# Patient Record
Sex: Female | Born: 1990 | Race: White | Hispanic: No | Marital: Married | Smoking: Never smoker
Health system: Southern US, Community
[De-identification: ages and names within clinical notes are randomized; demographics above are authoritative.]

## PROBLEM LIST (undated history)

## (undated) DIAGNOSIS — R51 Headache: Secondary | ICD-10-CM

## (undated) DIAGNOSIS — J45909 Unspecified asthma, uncomplicated: Secondary | ICD-10-CM

## (undated) DIAGNOSIS — R519 Headache, unspecified: Secondary | ICD-10-CM

## (undated) DIAGNOSIS — N3010 Interstitial cystitis (chronic) without hematuria: Secondary | ICD-10-CM

## (undated) DIAGNOSIS — R102 Pelvic and perineal pain: Secondary | ICD-10-CM

## (undated) HISTORY — DX: Headache, unspecified: R51.9

## (undated) HISTORY — PX: WISDOM TOOTH EXTRACTION: SHX21

## (undated) HISTORY — DX: Headache: R51

## (undated) HISTORY — DX: Interstitial cystitis (chronic) without hematuria: N30.10

## (undated) HISTORY — PX: TONSILLECTOMY: SUR1361

---
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
## 2012-02-28 ENCOUNTER — Ambulatory Visit: Payer: Self-pay | Admitting: Urology

## 2012-02-28 HISTORY — PX: OTHER SURGICAL HISTORY: SHX169

## 2012-02-28 LAB — PREGNANCY, URINE: Pregnancy Test, Urine: NEGATIVE m[IU]/mL

## 2012-03-20 ENCOUNTER — Ambulatory Visit: Payer: Self-pay | Admitting: Urology

## 2013-01-08 ENCOUNTER — Ambulatory Visit: Payer: Self-pay | Admitting: Family Medicine

## 2013-01-08 LAB — URINALYSIS, COMPLETE
Glucose,UR: NEGATIVE mg/dL (ref 0–75)
Ketone: NEGATIVE
Ph: 6.5 (ref 4.5–8.0)

## 2013-01-10 LAB — URINE CULTURE

## 2013-01-11 LAB — BETA STREP CULTURE(ARMC)

## 2014-06-15 ENCOUNTER — Inpatient Hospital Stay (HOSPITAL_COMMUNITY)
Admission: AD | Admit: 2014-06-15 | Discharge: 2014-06-15 | Disposition: A | Discharge status: Home / Self Care | Payer: BC Managed Care – PPO | Source: Ambulatory Visit | Attending: Obstetrics and Gynecology | Admitting: Obstetrics and Gynecology

## 2014-06-15 ENCOUNTER — Encounter (HOSPITAL_COMMUNITY): Payer: Self-pay

## 2014-06-15 ENCOUNTER — Inpatient Hospital Stay (HOSPITAL_COMMUNITY): Payer: BC Managed Care – PPO

## 2014-06-15 DIAGNOSIS — R1031 Right lower quadrant pain: Secondary | ICD-10-CM | POA: Insufficient documentation

## 2014-06-15 DIAGNOSIS — R52 Pain, unspecified: Secondary | ICD-10-CM | POA: Insufficient documentation

## 2014-06-15 DIAGNOSIS — N949 Unspecified condition associated with female genital organs and menstrual cycle: Secondary | ICD-10-CM

## 2014-06-15 DIAGNOSIS — R102 Pelvic and perineal pain: Secondary | ICD-10-CM

## 2014-06-15 LAB — URINALYSIS, ROUTINE W REFLEX MICROSCOPIC
BILIRUBIN URINE: NEGATIVE
GLUCOSE, UA: NEGATIVE mg/dL
Hgb urine dipstick: NEGATIVE
KETONES UR: NEGATIVE mg/dL
Leukocytes, UA: NEGATIVE
Nitrite: NEGATIVE
PH: 8.5 — AB (ref 5.0–8.0)
Protein, ur: NEGATIVE mg/dL
Specific Gravity, Urine: 1.01 (ref 1.005–1.030)
Urobilinogen, UA: 0.2 mg/dL (ref 0.0–1.0)

## 2014-06-15 LAB — CBC
HCT: 40.7 % (ref 36.0–46.0)
Hemoglobin: 14.1 g/dL (ref 12.0–15.0)
MCH: 31.8 pg (ref 26.0–34.0)
MCHC: 34.6 g/dL (ref 30.0–36.0)
MCV: 91.9 fL (ref 78.0–100.0)
PLATELETS: 278 10*3/uL (ref 150–400)
RBC: 4.43 MIL/uL (ref 3.87–5.11)
RDW: 13.1 % (ref 11.5–15.5)
WBC: 8.9 10*3/uL (ref 4.0–10.5)

## 2014-06-15 LAB — WET PREP, GENITAL
TRICH WET PREP: NONE SEEN
Yeast Wet Prep HPF POC: NONE SEEN

## 2014-06-15 LAB — POCT PREGNANCY, URINE: Preg Test, Ur: NEGATIVE

## 2014-06-15 MED ORDER — KETOROLAC TROMETHAMINE 60 MG/2ML IM SOLN
60.0000 mg | INTRAMUSCULAR | Status: AC
  Administered 2014-06-15: 60 mg via INTRAMUSCULAR
  Filled 2014-06-15: qty 2

## 2014-06-15 MED ORDER — HYDROCODONE-ACETAMINOPHEN 5-325 MG PO TABS: 1.0000 | ORAL_TABLET | Freq: Four times a day (QID) | ORAL | Status: DC | PRN

## 2014-06-15 NOTE — Discharge Instructions (Signed)
Abdominal Pain, Women °Abdominal (stomach, pelvic, or belly) pain can be caused by many things. It is important to tell your doctor: °· The location of the pain. °· Does it come and go or is it present all the time? °· Are there things that start the pain (eating certain foods, exercise)? °· Are there other symptoms associated with the pain (fever, nausea, vomiting, diarrhea)? °All of this is helpful to know when trying to find the cause of the pain. °CAUSES  °· Stomach: virus or bacteria infection, or ulcer. °· Intestine: appendicitis (inflamed appendix), regional ileitis (Crohn's disease), ulcerative colitis (inflamed colon), irritable bowel syndrome, diverticulitis (inflamed diverticulum of the colon), or cancer of the stomach or intestine. °· Gallbladder disease or stones in the gallbladder. °· Kidney disease, kidney stones, or infection. °· Pancreas infection or cancer. °· Fibromyalgia (pain disorder). °· Diseases of the female organs: °¨ Uterus: fibroid (non-cancerous) tumors or infection. °¨ Fallopian tubes: infection or tubal pregnancy. °¨ Ovary: cysts or tumors. °¨ Pelvic adhesions (scar tissue). °¨ Endometriosis (uterus lining tissue growing in the pelvis and on the pelvic organs). °¨ Pelvic congestion syndrome (female organs filling up with blood just before the menstrual period). °¨ Pain with the menstrual period. °¨ Pain with ovulation (producing an egg). °¨ Pain with an IUD (intrauterine device, birth control) in the uterus. °¨ Cancer of the female organs. °· Functional pain (pain not caused by a disease, may improve without treatment). °· Psychological pain. °· Depression. °DIAGNOSIS  °Your doctor will decide the seriousness of your pain by doing an examination. °· Blood tests. °· X-rays. °· Ultrasound. °· CT scan (computed tomography, special type of X-ray). °· MRI (magnetic resonance imaging). °· Cultures, for infection. °· Barium enema (dye inserted in the large intestine, to better view it with  X-rays). °· Colonoscopy (looking in intestine with a lighted tube). °· Laparoscopy (minor surgery, looking in abdomen with a lighted tube). °· Major abdominal exploratory surgery (looking in abdomen with a large incision). °TREATMENT  °The treatment will depend on the cause of the pain.  °· Many cases can be observed and treated at home. °· Over-the-counter medicines recommended by your caregiver. °· Prescription medicine. °· Antibiotics, for infection. °· Birth control pills, for painful periods or for ovulation pain. °· Hormone treatment, for endometriosis. °· Nerve blocking injections. °· Physical therapy. °· Antidepressants. °· Counseling with a psychologist or psychiatrist. °· Minor or major surgery. °HOME CARE INSTRUCTIONS  °· Do not take laxatives, unless directed by your caregiver. °· Take over-the-counter pain medicine only if ordered by your caregiver. Do not take aspirin because it can cause an upset stomach or bleeding. °· Try a clear liquid diet (broth or water) as ordered by your caregiver. Slowly move to a bland diet, as tolerated, if the pain is related to the stomach or intestine. °· Have a thermometer and take your temperature several times a day, and record it. °· Bed rest and sleep, if it helps the pain. °· Avoid sexual intercourse, if it causes pain. °· Avoid stressful situations. °· Keep your follow-up appointments and tests, as your caregiver orders. °· If the pain does not go away with medicine or surgery, you may try: °¨ Acupuncture. °¨ Relaxation exercises (yoga, meditation). °¨ Group therapy. °¨ Counseling. °SEEK MEDICAL CARE IF:  °· You notice certain foods cause stomach pain. °· Your home care treatment is not helping your pain. °· You need stronger pain medicine. °· You want your IUD removed. °· You feel faint or   lightheaded. °· You develop nausea and vomiting. °· You develop a rash. °· You are having side effects or an allergy to your medicine. °SEEK IMMEDIATE MEDICAL CARE IF:  °· Your  pain does not go away or gets worse. °· You have a fever. °· Your pain is felt only in portions of the abdomen. The right side could possibly be appendicitis. The left lower portion of the abdomen could be colitis or diverticulitis. °· You are passing blood in your stools (bright red or black tarry stools, with or without vomiting). °· You have blood in your urine. °· You develop chills, with or without a fever. °· You pass out. °MAKE SURE YOU:  °· Understand these instructions. °· Will watch your condition. °· Will get help right away if you are not doing well or get worse. °Document Released: 09/04/2007 Document Revised: 03/24/2014 Document Reviewed: 09/24/2009 °[employer]® Patient Information ©2015 [employer]. This information is not intended to replace advice given to you by your health care provider. Make sure you discuss any questions you have with your health care provider. ° °

## 2014-06-15 NOTE — MAU Note (Signed)
Knife-like pain rt lower abd since this am. Nausea and vomited x one. LMP 06/01/2014

## 2014-06-15 NOTE — MAU Provider Note (Signed)
Chief Complaint: Abdominal Pain   First Provider Initiated Contact with Patient 06/15/14 1940     SUBJECTIVE HPI: Judith Chapman is a 23 y.o. G0 who presents to maternity admissions reporting RLQ constant pain all day today.  She had nausea earlier with vomiting x1.  She has hx of ruptured ovarian cysts and feels like this pain is similar.  Bowel and bladder habits have been normal.  She denies vaginal bleeding, vaginal itching/burning, urinary symptoms, h/a, dizziness, or fever/chills.     Past Medical History  Diagnosis Date  . Urinary tract infection    Past Surgical History  Procedure Laterality Date  . Cystostomy w/ bladder biopsy     History   Social History  . Marital Status: Single    Spouse Name: N/A    Number of Children: N/A  . Years of Education: N/A   Occupational History  . Not on file.   Social History Main Topics  . Smoking status: Never Smoker   . Smokeless tobacco: Not on file  . Alcohol Use: No  . Drug Use: No  . Sexual Activity: Not on file   Other Topics Concern  . Not on file   Social History Narrative  . No narrative on file   No current facility-administered medications on file prior to encounter.   No current outpatient prescriptions on file prior to encounter.   Allergies  Allergen Reactions  . Doxycycline Anaphylaxis  . Augmentin [Amoxicillin-Pot Clavulanate] Nausea And Vomiting  . Sulfur Rash    ROS: Pertinent items in HPI  OBJECTIVE Blood pressure 119/62, pulse 66, temperature 98.1 F (36.7 C), temperature source Oral, resp. rate 16, height 5' 5.25" (1.657 m), weight 63.504 kg (140 lb), last menstrual period 06/01/2014, SpO2 100.00%. GENERAL: Well-developed, well-nourished female in no acute distress.  HEENT: Normocephalic HEART: normal rate RESP: normal effort ABDOMEN: Soft, mild tenderness right lower abdomen, no rebound tenderness, no guarding EXTREMITIES: Nontender, no edema NEURO: Alert and oriented Pelvic exam: Cervix  pink, visually closed, without lesion, scant white creamy discharge, vaginal walls and external genitalia normal Bimanual exam: Cervix 0/long/high, firm, anterior, neg CMT, uterus nontender, nonenlarged, adnexa with mild tenderness on right, no enlargement or mass bilaterally  LAB RESULTS Results for orders placed during the hospital encounter of 06/15/14 (from the past 24 hour(s))  URINALYSIS, ROUTINE W REFLEX MICROSCOPIC     Status: Abnormal   Collection Time    06/15/14  7:10 PM      Result Value Ref Range   Color, Urine YELLOW  YELLOW   APPearance CLEAR  CLEAR   Specific Gravity, Urine 1.010  1.005 - 1.030   pH 8.5 (*) 5.0 - 8.0   Glucose, UA NEGATIVE  NEGATIVE mg/dL   Hgb urine dipstick NEGATIVE  NEGATIVE   Bilirubin Urine NEGATIVE  NEGATIVE   Ketones, ur NEGATIVE  NEGATIVE mg/dL   Protein, ur NEGATIVE  NEGATIVE mg/dL   Urobilinogen, UA 0.2  0.0 - 1.0 mg/dL   Nitrite NEGATIVE  NEGATIVE   Leukocytes, UA NEGATIVE  NEGATIVE  POCT PREGNANCY, URINE     Status: None   Collection Time    06/15/14  7:22 PM      Result Value Ref Range   Preg Test, Ur NEGATIVE  NEGATIVE  CBC     Status: None   Collection Time    06/15/14  8:10 PM      Result Value Ref Range   WBC 8.9  4.0 - 10.5 K/uL   RBC  4.43  3.87 - 5.11 MIL/uL   Hemoglobin 14.1  12.0 - 15.0 g/dL   HCT 27.040.7  35.036.0 - 09.346.0 %   MCV 91.9  78.0 - 100.0 fL   MCH 31.8  26.0 - 34.0 pg   MCHC 34.6  30.0 - 36.0 g/dL   RDW 81.813.1  29.911.5 - 37.115.5 %   Platelets 278  150 - 400 K/uL  WET PREP, GENITAL     Status: Abnormal   Collection Time    06/15/14  8:19 PM      Result Value Ref Range   Yeast Wet Prep HPF POC NONE SEEN  NONE SEEN   Trich, Wet Prep NONE SEEN  NONE SEEN   Clue Cells Wet Prep HPF POC FEW (*) NONE SEEN   WBC, Wet Prep HPF POC FEW (*) NONE SEEN    IMAGING [place] Transvaginal Non-ob  06/15/2014   CLINICAL DATA:  Right lower quadrant pain  EXAM: TRANSABDOMINAL AND TRANSVAGINAL ULTRASOUND OF PELVIS  TECHNIQUE: Both transabdominal  and transvaginal ultrasound examinations of the pelvis were performed. Transabdominal technique was performed for global imaging of the pelvis including uterus, ovaries, adnexal regions, and pelvic cul-de-sac. It was necessary to proceed with endovaginal exam following the transabdominal exam to visualize the ovaries.  COMPARISON:  None  FINDINGS: Uterus  Measurements: 6.7 x 2.9 x 3.3 cm. No fibroids or other mass visualized.  Endometrium  Thickness: 6 mm.  No focal abnormality visualized.  Right ovary  Measurements: 26 x 17 x 17 mm. Normal appearance/no adnexal mass.  Left ovary  Measurements: 28 x 17 x 15 mm. Normal appearance/no adnexal mass.  Other findings  No free fluid.  IMPRESSION: Normal pelvic ultrasound   Electronically Signed   By: Esperanza Heiraymond  Rubner M.D.   On: 06/15/2014 21:59   [place] Pelvis Complete  06/15/2014   CLINICAL DATA:  Right lower quadrant pain  EXAM: TRANSABDOMINAL AND TRANSVAGINAL ULTRASOUND OF PELVIS  TECHNIQUE: Both transabdominal and transvaginal ultrasound examinations of the pelvis were performed. Transabdominal technique was performed for global imaging of the pelvis including uterus, ovaries, adnexal regions, and pelvic cul-de-sac. It was necessary to proceed with endovaginal exam following the transabdominal exam to visualize the ovaries.  COMPARISON:  None  FINDINGS: Uterus  Measurements: 6.7 x 2.9 x 3.3 cm. No fibroids or other mass visualized.  Endometrium  Thickness: 6 mm.  No focal abnormality visualized.  Right ovary  Measurements: 26 x 17 x 17 mm. Normal appearance/no adnexal mass.  Left ovary  Measurements: 28 x 17 x 15 mm. Normal appearance/no adnexal mass.  Other findings  No free fluid.  IMPRESSION: Normal pelvic ultrasound   Electronically Signed   By: Esperanza Heiraymond  Rubner M.D.   On: 06/15/2014 21:59    ASSESSMENT 1. Acute pelvic pain, female     PLAN Consult Dr Rana SnareLowe Discharge home Discussed normal labs and ultrasound with pt and her mother who is present.  Likely not  a Gyn cause for pain.  Vicodin 5/500, take 1-2 tabs Q 6 hours PRN x 10 tabs F/U with GI if symptoms persist If sudden increase in pain or n/v, go to [facility] or [facility] Return to MAU as needed for Gyn emergencies    Medication List         B COMPLEX PO  Take 2 tablets by mouth daily.     DULoxetine 30 MG capsule  Commonly known as:  CYMBALTA  Take 60 mg by mouth daily.     HYDROcodone-acetaminophen 5-325 MG  per tablet  Commonly known as:  NORCO/VICODIN  Take 1-2 tablets by mouth every 6 (six) hours as needed for moderate pain.     ibuprofen 200 MG tablet  Commonly known as:  ADVIL,MOTRIN  Take 800 mg by mouth every 6 (six) hours as needed for moderate pain.     MINASTRIN 24 FE 1-20 MG-MCG(24) Chew  Generic drug:  Norethin Ace-Eth Estrad-FE  Chew 1 tablet by mouth daily.       Follow-up Information   Please follow up. (With your GI doctor if symptoms persist.)       Please follow up. [facility] or [facility] if onset of nausea with vomitting or severe abdominal pain.  Return to MAU as needed for Gyn emergencies.)       Sharen Counter Certified Nurse-Midwife 06/15/2014  10:22 PM

## 2014-06-16 LAB — GC/CHLAMYDIA PROBE AMP
CT Probe RNA: NEGATIVE
GC Probe RNA: NEGATIVE

## 2014-06-18 ENCOUNTER — Ambulatory Visit: Payer: Self-pay | Admitting: Family Medicine

## 2014-09-30 ENCOUNTER — Ambulatory Visit: Payer: Self-pay | Admitting: Otolaryngology

## 2015-03-15 NOTE — Op Note (Signed)
PATIENT NAME:  Judith Chapman, Judith Chapman MR#:  [id number] DATE OF BIRTH:  [birth year]  DATE OF PROCEDURE:  02/28/2012  PREOPERATIVE DIAGNOSIS: Bladder pain and pressure.   POSTOPERATIVE DIAGNOSIS: Bladder pain and pressure. Bladder neoplasm uncertain.   PROCEDURE: Cystoscopy, hydrodistention,  bladder biopsy.   SURGEON: Assunta GamblesBrian Phoenyx Paulsen, Chapman.D.   ANESTHESIA: Laryngeal mask airway anesthesia.   INDICATIONS: The patient is an 24 year old white female with a long history of bladder issues. She has had significant bladder pain, pressure, urinary frequency, urgency with symptoms suggestive of possible underlying interstitial cystitis. She presents for cystoscopy and hydrodistention.   DESCRIPTION OF PROCEDURE: After informed consent was obtained, the patient was taken to the operating room and placed in the dorsal lithotomy position under laryngeal mask airway anesthesia. The patient was then prepped and draped in the usual standard fashion. The 22 French rigid cystoscope was introduced into the urethra under direct vision with no urethral abnormalities noted. Upon entering the bladder, the mucosa was inspected in its entirety and there was an area approximately 8 to 10 mm of raised irregular, nonerythematous mucosa on the right posterior bladder wall. It was fairly well defined. Prominent hypervascularity was also noted throughout the entire bladder with no other gross abnormalities. Bilateral ureteral orifices were well visualized with no lesions noted. The bladder was then filled under gravity instillation with sterile water. The first fill was taken to 650 mL. The second fill was to 800 mL. A third fill was to 825 mL. A few areas of patchy glomerulations were noted on the bladder base and dome of the bladder. Minimal bleeding from these areas was noted. No excoriations were appreciated. After the third fill, cold cup biopsy forceps were then utilized to obtain biopsies of the abnormal area on the right posterior bladder  wall. The area was then cauterized utilizing a Bugbee electrode. The specimen was sent to pathology for further evaluation. The bladder was then drained. The cystoscope was removed. An 318 French red rubber catheter was inserted into the urinary bladder. 40 mL of 2% lidocaine was instilled into the urinary bladder. The catheter was then removed. The patient was returned to the supine position.   Also of note, on examination and at placement of the B and O suppository, the patient's rectum was noted to be in an open position. Minimal rectal tone was present. There was an approximate 1-cm opening of the rectum which is atypical with no paralysis. This may indicate a neurological source also for her symptoms. This may need further evaluation. She does have a long history also of bowel issues.   The patient was awakened from laryngeal mask airway anesthesia. She was taken to the recovery room in stable condition. There were no problems or complications. The patient tolerated the procedure well.     ____________________________ Madolyn FriezeBrian S. Achilles Dunkope, MD bsc:bjt D: 02/28/2012 10:11:47 ET T: 02/28/2012 10:33:57 ET JOB#: [id number]  cc: Madolyn FriezeBrian S. Achilles Dunkope, MD, <Dictator> Madolyn FriezeBRIAN S Lynnlee Revels MD ELECTRONICALLY SIGNED 02/29/2012 7:37

## 2015-03-16 LAB — SURGICAL PATHOLOGY

## 2015-08-04 ENCOUNTER — Other Ambulatory Visit: Payer: Self-pay | Admitting: Urology

## 2015-08-19 ENCOUNTER — Encounter (HOSPITAL_BASED_OUTPATIENT_CLINIC_OR_DEPARTMENT_OTHER): Payer: Self-pay | Admitting: *Deleted

## 2015-08-20 ENCOUNTER — Encounter (HOSPITAL_BASED_OUTPATIENT_CLINIC_OR_DEPARTMENT_OTHER): Payer: Self-pay | Admitting: *Deleted

## 2015-08-20 NOTE — Progress Notes (Signed)
To [facility] at 1030-Hg,urine pregnancy on arrival-instructed Npo after [place]-verbalized understands.

## 2015-08-26 NOTE — H&P (Signed)
History of Present Illness   I was consulted by Julio Sicks, NP regarding Judith Chapman's recurrent urinary tract infections with possible history of interstitial cystitis.   She thinks once or twice a month she will have an acute onset of dysuria and/or back pain and cramps that usually respond favorably to antibiotics. She had a more protracted episode not responding to ciprofloxacin recently. It had felt different and she felt sick to her stomach.    When I saw Judith Chapman in July 2016 she had vaginal dryness that was out of the ordinary for her age. She was on trimethoprim. She had been doing well but had gone to the beach and may have had bladder irritants. She was on Uribel and her culture was negative. She may need a hydrodistention.   Urinalysis: No bacteria.   Frequency: Stable.    Past Medical History Problems  1. History of Anxiety (F41.9) 2. History of asthma (Z87.09) 3. History of esophageal reflux (Z87.19) 4. History of urinary tract infection (Z87.440)  Surgical History Problems  1. History of Cystoscopy (Diagnostic) 2. History of Oral Surgery Tooth Extraction 3. History of Septoplasty 4. History of Tonsillectomy  Current Meds 1. Minastrin 24 Fe CHEW;  Therapy: (Recorded:13May2016) to Recorded 2. Trimethoprim 100 MG Oral Tablet; 100 mg daily;  Therapy: 02Jun2016 to (Last Rx:02Jun2016)  Requested for: 02Jun2016 Ordered 3. Uribel 118 MG Oral Capsule; TAKE 1 CAPSULE Twice daily PRN;  Therapy: 25Jul2016 to (Last Rx:25Jul2016) Ordered  Allergies Medication  1. doxycycline 2. Augmentin 3. Sulfa Drugs  Family History Problems  1. Family history of hypertension (Z82.49) : Mother 2. Family history of kidney stones (Z84.1) : Mother, Grandfather  Social History Problems  1. Alcohol use (Z78.9)   social drinking only 2. Employed   photographer 3. Never a smoker 4. Occasional caffeine consumption  Results/Data  Urine [Data Includes: Last 1 Day]   12Sep2016   COLOR STRAW   APPEARANCE CLEAR   SPECIFIC GRAVITY <1.005   pH 6.5   GLUCOSE NEGATIVE   BILIRUBIN NEGATIVE   KETONE NEGATIVE   BLOOD TRACE   PROTEIN NEGATIVE   NITRITE NEGATIVE   LEUKOCYTE ESTERASE NEGATIVE   SQUAMOUS EPITHELIAL/HPF NONE SEEN HPF  WBC NONE SEEN WBC/HPF  RBC 0-2 RBC/HPF  BACTERIA NONE SEEN HPF  CRYSTALS See Below HPF  CASTS NONE SEEN LPF  Yeast NONE SEEN HPF   Assessment Assessed  1. Dyspareunia, female (N94.1) 2. Chronic cystitis (N30.20)  Plan Dyspareunia, female  1. Start: Meloxicam 15 MG Oral Tablet (Mobic); TAKE 1 TABLET DAILY  Discussion/Summary   Judith Chapman is still having a lot of bladder irritation and frequency. I am going to send her urine again for culture. She is on daily trimethoprim. She is having good days and bad days on and off Uribel.   I went over hydrodistention.  We talked about cystoscopy/hydrodistention and instillation in detail. Pros, cons, general surgical and anesthetic risks, and other options including watchful waiting were discussed. Risks were described but not limited to pain, infection, and bleeding. The risk of bladder perforation and management were discussed. The patient understands that it is primarily a diagnostic procedure.   She may have had this done by Dr Achilles Dunk in [place].   Urine was sent for culture. I gave her a Mobic prescription 15 mg #30 tablets and 2 refills and reviewed the side effects. She will stop her trimethoprim. She will take Uribel p.r.n. Proceed with hydrodistention.   After a thorough review of the management  options for the patient's condition the patient  elected to proceed with surgical therapy as noted above. We have discussed the potential benefits and risks of the procedure, side effects of the proposed treatment, the likelihood of the patient achieving the goals of the procedure, and any potential problems that might occur during the procedure or recuperation. Informed consent has been  obtained.

## 2015-08-27 ENCOUNTER — Encounter (HOSPITAL_BASED_OUTPATIENT_CLINIC_OR_DEPARTMENT_OTHER): Payer: Self-pay | Admitting: Anesthesiology

## 2015-08-27 ENCOUNTER — Ambulatory Visit (HOSPITAL_BASED_OUTPATIENT_CLINIC_OR_DEPARTMENT_OTHER): Payer: 59 | Admitting: Anesthesiology

## 2015-08-27 ENCOUNTER — Ambulatory Visit (HOSPITAL_BASED_OUTPATIENT_CLINIC_OR_DEPARTMENT_OTHER)
Admission: RE | Admit: 2015-08-27 | Discharge: 2015-08-27 | Disposition: A | Discharge status: Home / Self Care | Payer: 59 | Source: Ambulatory Visit | Attending: Urology | Admitting: Urology

## 2015-08-27 ENCOUNTER — Encounter (HOSPITAL_BASED_OUTPATIENT_CLINIC_OR_DEPARTMENT_OTHER)
Admission: RE | Disposition: A | Discharge status: Home / Self Care | Payer: Self-pay | Source: Ambulatory Visit | Attending: Urology

## 2015-08-27 DIAGNOSIS — Z882 Allergy status to sulfonamides status: Secondary | ICD-10-CM | POA: Diagnosis not present

## 2015-08-27 DIAGNOSIS — K219 Gastro-esophageal reflux disease without esophagitis: Secondary | ICD-10-CM | POA: Insufficient documentation

## 2015-08-27 DIAGNOSIS — F419 Anxiety disorder, unspecified: Secondary | ICD-10-CM | POA: Diagnosis not present

## 2015-08-27 DIAGNOSIS — J45909 Unspecified asthma, uncomplicated: Secondary | ICD-10-CM | POA: Insufficient documentation

## 2015-08-27 DIAGNOSIS — Z881 Allergy status to other antibiotic agents status: Secondary | ICD-10-CM | POA: Insufficient documentation

## 2015-08-27 DIAGNOSIS — N941 Unspecified dyspareunia: Secondary | ICD-10-CM | POA: Insufficient documentation

## 2015-08-27 DIAGNOSIS — N3010 Interstitial cystitis (chronic) without hematuria: Secondary | ICD-10-CM | POA: Diagnosis not present

## 2015-08-27 DIAGNOSIS — R102 Pelvic and perineal pain: Secondary | ICD-10-CM | POA: Diagnosis present

## 2015-08-27 HISTORY — DX: Pelvic and perineal pain: R10.2

## 2015-08-27 HISTORY — PX: CYSTO WITH HYDRODISTENSION: SHX5453

## 2015-08-27 LAB — POCT PREGNANCY, URINE: PREG TEST UR: NEGATIVE

## 2015-08-27 LAB — POCT HEMOGLOBIN-HEMACUE: HEMOGLOBIN: 14.6 g/dL (ref 12.0–15.0)

## 2015-08-27 SURGERY — CYSTOSCOPY, WITH BLADDER HYDRODISTENSION
Anesthesia: General | Site: Bladder

## 2015-08-27 MED ORDER — FENTANYL CITRATE (PF) 100 MCG/2ML IJ SOLN
INTRAMUSCULAR | Status: AC
  Filled 2015-08-27: qty 2

## 2015-08-27 MED ORDER — HYDROCODONE-ACETAMINOPHEN 5-325 MG PO TABS
ORAL_TABLET | ORAL | Status: AC
  Filled 2015-08-27: qty 1

## 2015-08-27 MED ORDER — FAMOTIDINE IN NACL 20-0.9 MG/50ML-% IV SOLN
INTRAVENOUS | Status: AC
  Filled 2015-08-27: qty 50

## 2015-08-27 MED ORDER — HYDROCODONE-ACETAMINOPHEN 5-325 MG PO TABS: 1.0000 | ORAL_TABLET | Freq: Four times a day (QID) | ORAL | Status: DC | PRN

## 2015-08-27 MED ORDER — LACTATED RINGERS IV SOLN
INTRAVENOUS | Status: DC
Start: 2015-08-27 — End: 2015-08-27
  Filled 2015-08-27: qty 1000

## 2015-08-27 MED ORDER — STERILE WATER FOR IRRIGATION IR SOLN
Status: DC | PRN
  Administered 2015-08-27: 1000 mL

## 2015-08-27 MED ORDER — FENTANYL CITRATE (PF) 100 MCG/2ML IJ SOLN
25.0000 ug | INTRAMUSCULAR | Status: DC | PRN
  Administered 2015-08-27 (×2): 25 ug via INTRAVENOUS
  Filled 2015-08-27: qty 1

## 2015-08-27 MED ORDER — MIDAZOLAM HCL 2 MG/2ML IJ SOLN
INTRAMUSCULAR | Status: AC
  Filled 2015-08-27: qty 2

## 2015-08-27 MED ORDER — PROPOFOL 10 MG/ML IV BOLUS
INTRAVENOUS | Status: DC | PRN
  Administered 2015-08-27: 170 mg via INTRAVENOUS

## 2015-08-27 MED ORDER — BUPIVACAINE HCL 0.5 % IJ SOLN
INTRAMUSCULAR | Status: DC | PRN
  Administered 2015-08-27: 15 mL

## 2015-08-27 MED ORDER — CIPROFLOXACIN IN D5W 400 MG/200ML IV SOLN
400.0000 mg | INTRAVENOUS | Status: AC
  Administered 2015-08-27: 400 mg via INTRAVENOUS
  Filled 2015-08-27: qty 200

## 2015-08-27 MED ORDER — LIDOCAINE HCL (CARDIAC) 20 MG/ML IV SOLN
INTRAVENOUS | Status: DC | PRN
  Administered 2015-08-27: 80 mg via INTRAVENOUS

## 2015-08-27 MED ORDER — LACTATED RINGERS IV SOLN
INTRAVENOUS | Status: DC
  Administered 2015-08-27: 11:00:00 via INTRAVENOUS
  Filled 2015-08-27: qty 1000

## 2015-08-27 MED ORDER — CITRIC ACID-SODIUM CITRATE 334-500 MG/5ML PO SOLN
30.0000 mL | Freq: Once | ORAL | Status: AC
  Administered 2015-08-27: 30 mL via ORAL
  Filled 2015-08-27: qty 30

## 2015-08-27 MED ORDER — HYDROCODONE-ACETAMINOPHEN 5-325 MG PO TABS
1.0000 | ORAL_TABLET | Freq: Once | ORAL | Status: AC
  Administered 2015-08-27: 1 via ORAL
  Filled 2015-08-27: qty 1

## 2015-08-27 MED ORDER — KETOROLAC TROMETHAMINE 30 MG/ML IJ SOLN
INTRAMUSCULAR | Status: DC | PRN
  Administered 2015-08-27: 30 mg via INTRAVENOUS

## 2015-08-27 MED ORDER — CITRIC ACID-SODIUM CITRATE 334-500 MG/5ML PO SOLN
ORAL | Status: AC
  Filled 2015-08-27: qty 30

## 2015-08-27 MED ORDER — MIDAZOLAM HCL 5 MG/5ML IJ SOLN
INTRAMUSCULAR | Status: DC | PRN
  Administered 2015-08-27: 2 mg via INTRAVENOUS

## 2015-08-27 MED ORDER — PHENAZOPYRIDINE HCL 200 MG PO TABS
ORAL_TABLET | ORAL | Status: DC | PRN
  Administered 2015-08-27: 12:00:00 via INTRAVESICAL

## 2015-08-27 MED ORDER — CIPROFLOXACIN IN D5W 400 MG/200ML IV SOLN
INTRAVENOUS | Status: AC
  Filled 2015-08-27: qty 200

## 2015-08-27 MED ORDER — ONDANSETRON HCL 4 MG/2ML IJ SOLN
INTRAMUSCULAR | Status: DC | PRN
  Administered 2015-08-27: 4 mg via INTRAVENOUS

## 2015-08-27 MED ORDER — DEXAMETHASONE SODIUM PHOSPHATE 4 MG/ML IJ SOLN
INTRAMUSCULAR | Status: DC | PRN
  Administered 2015-08-27: 10 mg via INTRAVENOUS

## 2015-08-27 MED ORDER — FAMOTIDINE IN NACL 20-0.9 MG/50ML-% IV SOLN
20.0000 mg | Freq: Once | INTRAVENOUS | Status: AC
  Administered 2015-08-27: 20 mg via INTRAVENOUS
  Filled 2015-08-27: qty 50

## 2015-08-27 MED ORDER — FENTANYL CITRATE (PF) 100 MCG/2ML IJ SOLN
INTRAMUSCULAR | Status: DC | PRN
  Administered 2015-08-27: 100 ug via INTRAVENOUS

## 2015-08-27 SURGICAL SUPPLY — 21 items
BAG DRAIN URO-CYSTO SKYTR STRL (DRAIN) ×3 IMPLANT
BAG DRN UROCATH (DRAIN) ×1
CANISTER SUCT LVC 12 LTR MEDI- (MISCELLANEOUS) IMPLANT
CATH FOLEY 2WAY SLVR  5CC 18FR (CATHETERS)
CATH FOLEY 2WAY SLVR 5CC 18FR (CATHETERS) IMPLANT
CATH ROBINSON RED A/P 12FR (CATHETERS) IMPLANT
CATH ROBINSON RED A/P 14FR (CATHETERS) IMPLANT
CLOTH BEACON ORANGE TIMEOUT ST (SAFETY) ×3 IMPLANT
DRSG PAD ABDOMINAL 8X10 ST (GAUZE/BANDAGES/DRESSINGS) ×2 IMPLANT
ELECT REM PT RETURN 9FT ADLT (ELECTROSURGICAL)
ELECTRODE REM PT RTRN 9FT ADLT (ELECTROSURGICAL) IMPLANT
GLOVE BIO SURGEON STRL SZ7.5 (GLOVE) ×3 IMPLANT
GOWN STRL REUS W/ TWL XL LVL3 (GOWN DISPOSABLE) ×1 IMPLANT
GOWN STRL REUS W/TWL XL LVL3 (GOWN DISPOSABLE) ×3
MANIFOLD NEPTUNE II (INSTRUMENTS) ×2 IMPLANT
NDL SAFETY ECLIPSE 18X1.5 (NEEDLE) ×1 IMPLANT
NEEDLE HYPO 18GX1.5 SHARP (NEEDLE) ×3
PACK CYSTO (CUSTOM PROCEDURE TRAY) ×3 IMPLANT
SUT SILK 0 TIES 10X30 (SUTURE) IMPLANT
SYR 20CC LL (SYRINGE) ×3 IMPLANT
WATER STERILE IRR 3000ML UROMA (IV SOLUTION) ×3 IMPLANT

## 2015-08-27 NOTE — Anesthesia Postprocedure Evaluation (Signed)
  Anesthesia Post-op Note  Patient: Judith Chapman  Procedure(s) Performed: Procedure(s) (LRB): CYSTOSCOPY HYDRODISTENSION AND INSTTILLATION OF MARCAINE AND PRYDIUM  (N/A)  Patient Location: PACU  Anesthesia Type: General  Level of Consciousness: awake and alert   Airway and Oxygen Therapy: Patient Spontanous Breathing  Post-op Pain: mild  Post-op Assessment: Post-op Vital signs reviewed, Patient's Cardiovascular Status Stable, Respiratory Function Stable, Patent Airway and No signs of Nausea or vomiting  Last Vitals:  Filed Vitals:   08/27/15 1245  BP: 98/71  Pulse: 79  Temp:   Resp: 16    Post-op Vital Signs: stable   Complications: No apparent anesthesia complications

## 2015-08-27 NOTE — Transfer of Care (Signed)
Immediate Anesthesia Transfer of Care Note  Patient: Judith Chapman  Procedure(s) Performed: Procedure(s): CYSTOSCOPY HYDRODISTENSION AND INSTTILLATION OF MARCAINE AND PRYDIUM  (N/A)  Patient Location: PACU  Anesthesia Type:General  Level of Consciousness: awake and oriented  Airway & Oxygen Therapy: Patient Spontanous Breathing  Post-op Assessment: Report given to RN  Post vital signs: Reviewed and stable  Last Vitals:  Filed Vitals:   08/27/15 1053  BP: 112/70  Pulse: 86  Temp: 36.7 C  Resp: 16    Complications: No apparent anesthesia complications

## 2015-08-27 NOTE — Discharge Instructions (Signed)
I have reviewed discharge instructions in detail with the patient. They will follow-up with me or their physician as scheduled. My nurse will also be calling the patients as per protocol.  ° ° ° °CYSTOSCOPY HOME CARE INSTRUCTIONS ° °Activity: °Rest for the remainder of the day.  Do not drive or operate equipment today.  You may resume normal activities in one to two days as instructed by your physician.  ° °Meals: °Drink plenty of liquids and eat light foods such as gelatin or soup this evening.  You may return to a normal meal plan tomorrow. ° °Return to Work: °You may return to work in one to two days or as instructed by your physician. ° °Special Instructions / Symptoms: °Call your physician if any of these symptoms occur: ° ° -persistent or heavy bleeding ° -bleeding which continues after first few urination ° -large blood clots that are difficult to pass ° -urine stream diminishes or stops completely ° -fever equal to or higher than 101 degrees Farenheit. ° -cloudy urine with a strong, foul odor ° -severe pain ° °Females should always wipe from front to back after elimination.  You may feel some burning pain when you urinate.  This should disappear with time.  Applying moist heat to the lower abdomen or a hot tub bath may help relieve the pain. \ ° °Follow-Up / Date of Return Visit to Your Physician:  °Call for an appointment to arrange follow-up. ° ° ° ° °Post Anesthesia Home Care Instructions ° °Activity: °Get plenty of rest for the remainder of the day. A responsible adult should stay with you for 24 hours following the procedure.  °For the next 24 hours, DO NOT: °-Drive a car °-Operate machinery °-Drink alcoholic beverages °-Take any medication unless instructed by your physician °-Make any legal decisions or sign important papers. ° °Meals: °Start with liquid foods such as gelatin or soup. Progress to regular foods as tolerated. Avoid greasy, spicy, heavy foods. If nausea and/or vomiting occur, drink only  clear liquids until the nausea and/or vomiting subsides. Call your physician if vomiting continues. ° °Special Instructions/Symptoms: °Your throat may feel dry or sore from the anesthesia or the breathing tube placed in your throat during surgery. If this causes discomfort, gargle with warm salt water. The discomfort should disappear within 24 hours. ° °If you had a scopolamine patch placed behind your ear for the management of post- operative nausea and/or vomiting: ° °1. The medication in the patch is effective for 72 hours, after which it should be removed.  Wrap patch in a tissue and discard in the trash. Wash hands thoroughly with soap and water. °2. You may remove the patch earlier than 72 hours if you experience unpleasant side effects which may include dry mouth, dizziness or visual disturbances. °3. Avoid touching the patch. Wash your hands with soap and water after contact with the patch. °  ° °

## 2015-08-27 NOTE — Anesthesia Preprocedure Evaluation (Addendum)
Anesthesia Evaluation  Patient identified by MRN, date of birth, ID band Patient awake    Reviewed: Allergy & Precautions, H&P , NPO status , Patient's Chart, lab work & pertinent test results  Airway Mallampati: II  TM Distance: >3 FB Neck ROM: full    Dental no notable dental hx. (+) Dental Advisory Given, Teeth Intact   Pulmonary neg pulmonary ROS,    Pulmonary exam normal breath sounds clear to auscultation       Cardiovascular Exercise Tolerance: Good negative cardio ROS Normal cardiovascular exam Rhythm:regular Rate:Normal     Neuro/Psych negative neurological ROS  negative psych ROS   GI/Hepatic negative GI ROS, Neg liver ROS, GERD  Medicated and Controlled,  Endo/Other  negative endocrine ROS  Renal/GU negative Renal ROS  negative genitourinary   Musculoskeletal   Abdominal   Peds  Hematology negative hematology ROS (+)   Anesthesia Other Findings   Reproductive/Obstetrics negative OB ROS                            Anesthesia Physical Anesthesia Plan  ASA: I  Anesthesia Plan: General   Post-op Pain Management:    Induction: Intravenous  Airway Management Planned: LMA  Additional Equipment:   Intra-op Plan:   Post-operative Plan:   Informed Consent: I have reviewed the patients History and Physical, chart, labs and discussed the procedure including the risks, benefits and alternatives for the proposed anesthesia with the patient or authorized representative who has indicated his/her understanding and acceptance.   Dental Advisory Given  Plan Discussed with: CRNA and Surgeon  Anesthesia Plan Comments:         Anesthesia Quick Evaluation

## 2015-08-27 NOTE — Anesthesia Procedure Notes (Signed)
Procedure Name: LMA Insertion Date/Time: 08/27/2015 12:18 PM Performed by: Maris Berger T Pre-anesthesia Checklist: Patient identified, Emergency Drugs available, Suction available and Patient being monitored Patient Re-evaluated:Patient Re-evaluated prior to inductionOxygen Delivery Method: Circle System Utilized Preoxygenation: Pre-oxygenation with 100% oxygen Intubation Type: IV induction Ventilation: Mask ventilation without difficulty LMA: LMA inserted LMA Size: 4.0 Number of attempts: 1 Airway Equipment and Method: Bite block Placement Confirmation: positive ETCO2 Tube secured with: Tape Dental Injury: Teeth and Oropharynx as per pre-operative assessment

## 2015-08-27 NOTE — Interval H&P Note (Signed)
History and Physical Interval Note:  08/27/2015 10:49 AM  Judith Chapman  has presented today for surgery, with the diagnosis of PELVIC PAIN   The various methods of treatment have been discussed with the patient and family. After consideration of risks, benefits and other options for treatment, the patient has consented to  Procedure(s): CYSTOSCOPY HYDRODISTENSION AND INSTTILLATION OF MARCAINE AND PRYDIUM  (N/A) as a surgical intervention .  The patient's history has been reviewed, patient examined, no change in status, stable for surgery.  I have reviewed the patient's chart and labs.  Questions were answered to the patient's satisfaction.     Mamie Hundertmark A

## 2015-08-27 NOTE — Op Note (Signed)
Preoperative diagnosis: Pelvic pain Postoperative diagnosis: Interstitial cystitis Surgery: Cystoscopy and hydrodistention and bladder installation therapy Surgeon: Dr. Lorin Picket Majestic Brister  The patient is above diagnoses and consented above procedure. Extra care was taken with leg positioning. Preoperative antibodies were given  21 [place] scope was utilized. Bladder mucosa and trigone were normal. She was hydrodistended to 600 mL. Bladder was emptied. On reinspection she had mild glomerulations throughout her bladder.  As a separate procedure after inking the bladder with a red rubber catheter I instilled 15 mL of 0.5% Marcaine +400 mg of peridium  The patient will be treated for interstitial cystitis

## 2015-08-28 ENCOUNTER — Encounter (HOSPITAL_BASED_OUTPATIENT_CLINIC_OR_DEPARTMENT_OTHER): Payer: Self-pay | Admitting: Urology

## 2017-06-01 ENCOUNTER — Emergency Department
Admission: EM | Admit: 2017-06-01 | Discharge: 2017-06-01 | Disposition: A | Discharge status: Home / Self Care | Payer: 59 | Attending: Student in an Organized Health Care Education/Training Program | Admitting: Student in an Organized Health Care Education/Training Program

## 2017-06-01 ENCOUNTER — Encounter: Payer: Self-pay | Admitting: Emergency Medicine

## 2017-06-01 DIAGNOSIS — R51 Headache: Secondary | ICD-10-CM | POA: Diagnosis not present

## 2017-06-01 DIAGNOSIS — Z79899 Other long term (current) drug therapy: Secondary | ICD-10-CM | POA: Diagnosis not present

## 2017-06-01 DIAGNOSIS — R519 Headache, unspecified: Secondary | ICD-10-CM

## 2017-06-01 LAB — URINALYSIS, COMPLETE (UACMP) WITH MICROSCOPIC
BILIRUBIN URINE: NEGATIVE
Bacteria, UA: NONE SEEN
GLUCOSE, UA: NEGATIVE mg/dL
KETONES UR: NEGATIVE mg/dL
LEUKOCYTES UA: NEGATIVE
NITRITE: NEGATIVE
Protein, ur: NEGATIVE mg/dL
Specific Gravity, Urine: 1.024 (ref 1.005–1.030)
pH: 6 (ref 5.0–8.0)

## 2017-06-01 LAB — BASIC METABOLIC PANEL
ANION GAP: 9 (ref 5–15)
BUN: 16 mg/dL (ref 6–20)
CHLORIDE: 104 mmol/L (ref 101–111)
CO2: 26 mmol/L (ref 22–32)
Calcium: 9.2 mg/dL (ref 8.9–10.3)
Creatinine, Ser: 0.82 mg/dL (ref 0.44–1.00)
GFR calc Af Amer: >60 mL/min (ref >60)
GFR calc non Af Amer: >60 mL/min (ref >60)
Glucose, Bld: 103 mg/dL — ABNORMAL HIGH (ref 65–99)
POTASSIUM: 4.3 mmol/L (ref 3.5–5.1)
SODIUM: 139 mmol/L (ref 135–145)

## 2017-06-01 LAB — CBC WITH DIFFERENTIAL/PLATELET
BASOS ABS: 0 10*3/uL (ref 0–0.1)
Basophils Relative: 1 %
EOS ABS: 0.2 10*3/uL (ref 0–0.7)
EOS PCT: 2 %
HCT: 44.4 % (ref 35.0–47.0)
HEMOGLOBIN: 15.2 g/dL (ref 12.0–16.0)
LYMPHS ABS: 2.2 10*3/uL (ref 1.0–3.6)
LYMPHS PCT: 29 %
MCH: 31.3 pg (ref 26.0–34.0)
MCHC: 34.3 g/dL (ref 32.0–36.0)
MCV: 91.1 fL (ref 80.0–100.0)
Monocytes Absolute: 0.5 10*3/uL (ref 0.2–0.9)
Monocytes Relative: 7 %
NEUTROS PCT: 61 %
Neutro Abs: 4.7 10*3/uL (ref 1.4–6.5)
PLATELETS: 265 10*3/uL (ref 150–440)
RBC: 4.87 MIL/uL (ref 3.80–5.20)
RDW: 13.1 % (ref 11.5–14.5)
WBC: 7.7 10*3/uL (ref 3.6–11.0)

## 2017-06-01 LAB — POCT PREGNANCY, URINE: PREG TEST UR: NEGATIVE

## 2017-06-01 LAB — TSH: TSH: 1.128 u[IU]/mL (ref 0.350–4.500)

## 2017-06-01 MED ORDER — SODIUM CHLORIDE 0.9 % IV BOLUS (SEPSIS): 1000.0000 mL | Freq: Once | INTRAVENOUS | Status: DC

## 2017-06-01 MED ORDER — KETOROLAC TROMETHAMINE 30 MG/ML IJ SOLN
15.0000 mg | Freq: Once | INTRAMUSCULAR | Status: AC
  Administered 2017-06-01: 15 mg via INTRAVENOUS
  Filled 2017-06-01: qty 1

## 2017-06-01 MED ORDER — DIPHENHYDRAMINE HCL 50 MG/ML IJ SOLN
12.5000 mg | Freq: Once | INTRAMUSCULAR | Status: AC
  Administered 2017-06-01: 12.5 mg via INTRAVENOUS
  Filled 2017-06-01: qty 1

## 2017-06-01 MED ORDER — SODIUM CHLORIDE 0.9 % IV BOLUS (SEPSIS)
1000.0000 mL | Freq: Once | INTRAVENOUS | Status: AC
  Administered 2017-06-01: 1000 mL via INTRAVENOUS

## 2017-06-01 MED ORDER — PROCHLORPERAZINE EDISYLATE 5 MG/ML IJ SOLN
10.0000 mg | Freq: Once | INTRAMUSCULAR | Status: AC
  Administered 2017-06-01: 10 mg via INTRAVENOUS
  Filled 2017-06-01: qty 2

## 2017-06-01 MED ORDER — DEXAMETHASONE SODIUM PHOSPHATE 10 MG/ML IJ SOLN
10.0000 mg | Freq: Once | INTRAMUSCULAR | Status: AC
  Administered 2017-06-01: 10 mg via INTRAVENOUS
  Filled 2017-06-01 (×2): qty 1

## 2017-06-01 NOTE — Discharge Instructions (Signed)

## 2017-06-01 NOTE — ED Triage Notes (Addendum)
Headache off and on x2 weeks, frontal/sinus and posterior /neck stiffness.  [place],  Vision fine , but with episodes on blurry/watery vision peripheral.  No fever noted. But feeling fatigue

## 2017-06-01 NOTE — ED Notes (Signed)
Pt alert and oriented X4, active, cooperative, pt in NAD. RR even and unlabored, color WNL.  Pt informed to return if any life threatening symptoms occur.   

## 2017-06-01 NOTE — ED Provider Notes (Signed)
[facility] Emergency Department Provider Note    First MD Initiated Contact with Patient 06/01/17 1526     (approximate)  I have reviewed the triage vital signs and the nursing notes.   HISTORY  Chief Complaint Headache    HPI Judith Chapman is a 26 y.o. female presents with chief complaint of 2 weeks of headache associated some neck stiffness and generalized fatigue. No fevers. No numbness or tingling. Denies any trauma. States that 2 weeks ago and felt very consistent to be a migraine for 2 days. The headache subsided but then came back in her symptoms are more related to frontal pressure and heaviness. Has not been having any cough or nasal congestion. No rashes. Did notice a tick on her week ago but it had not bitten her and her headache started before the tick. Did recently travel to [place] on a cruise and was him down for less than a day today. No associated nausea vomiting or chest pain.   Past Medical History:  Diagnosis Date  . Pelvic pain in female    No family history on file. Past Surgical History:  Procedure Laterality Date  . CYSTO WITH HYDRODISTENSION N/A 08/27/2015   Procedure: CYSTOSCOPY HYDRODISTENSION AND INSTTILLATION OF MARCAINE AND PRYDIUM ;  Surgeon: Alfredo Martinez, MD;  Location: [facility];  Service: Urology;  Laterality: N/A;  . CYSTO/  HYDRODISTENTION/  BLADDER BX  02-28-2012   There are no active problems to display for this patient.     Prior to Admission medications   Medication Sig Start Date End Date Taking? Authorizing Provider  B Complex Vitamins (B COMPLEX PO) Take 2 tablets by mouth as needed.     [provider]  DULoxetine (CYMBALTA) 30 MG capsule Take 60 mg by mouth daily.    [provider]  HYDROcodone-acetaminophen (NORCO) 5-325 MG tablet Take 1 tablet by mouth every 6 (six) hours as needed for moderate pain. 08/27/15   Alfredo Martinez, MD  ibuprofen (ADVIL,MOTRIN) 200  MG tablet Take 800 mg by mouth every 6 (six) hours as needed for moderate pain.    [provider]  meloxicam (MOBIC) 7.5 MG tablet Take 7.5 mg by mouth daily.    [provider]  Norethin Ace-Eth Estrad-FE (MINASTRIN 24 FE) 1-20 MG-MCG(24) CHEW Chew 1 tablet by mouth daily.    [provider]  trimethoprim (TRIMPEX) 100 MG tablet Take 100 mg by mouth daily.    [provider]    Allergies Doxycycline; Morphine and related; Augmentin [amoxicillin-pot clavulanate]; and Sulfa antibiotics    Social History Social History  Substance Use Topics  . Smoking status: Never Smoker  . Smokeless tobacco: Former Neurosurgeon  . Alcohol use No    Review of Systems Patient denies headaches, rhinorrhea, blurry vision, numbness, shortness of breath, chest pain, edema, cough, abdominal pain, nausea, vomiting, diarrhea, dysuria, fevers, rashes or hallucinations unless otherwise stated above in HPI. ____________________________________________   PHYSICAL EXAM:  VITAL SIGNS: Vitals:   06/01/17 1500  BP: 138/83  Pulse: 99  Resp: 18  Temp: 98.5 F (36.9 C)    Constitutional: Alert and oriented. Well appearing and in no acute distress. Eyes: Conjunctivae are normal.  Head: Atraumatic. Nose: No congestion/rhinnorhea. Mouth/Throat: Mucous membranes are moist.   Neck: No stridor. Painless ROM.  Cardiovascular: Normal rate, regular rhythm. Grossly normal heart sounds.  Good peripheral circulation. Respiratory: Normal respiratory effort.  No retractions. Lungs CTAB. Gastrointestinal: Soft and nontender. No distention. No  abdominal bruits. No CVA tenderness. Genitourinary:  Musculoskeletal: No lower extremity tenderness nor edema.  No joint effusions. Neurologic:  CN- intact.  No facial droop, Normal FNF.  Normal heel to shin.  Sensation intact bilaterally. Normal speech and language. No gross focal neurologic deficits are appreciated. No gait instability. Skin:  Skin is  warm, dry and intact. No rash noted. Psychiatric: Mood and affect are normal. Speech and behavior are normal.  ____________________________________________   LABS (all labs ordered are listed, but only abnormal results are displayed)  Results for orders placed or performed during the hospital encounter of 06/01/17 (from the past 24 hour(s))  CBC with Differential/Platelet     Status: None   Collection Time: 06/01/17  3:41 PM  Result Value Ref Range   WBC 7.7 3.6 - 11.0 K/uL   RBC 4.87 3.80 - 5.20 MIL/uL   Hemoglobin 15.2 12.0 - 16.0 g/dL   HCT 69.6 29.5 - 28.4 %   MCV 91.1 80.0 - 100.0 fL   MCH 31.3 26.0 - 34.0 pg   MCHC 34.3 32.0 - 36.0 g/dL   RDW 13.2 44.0 - 10.2 %   Platelets 265 150 - 440 K/uL   Neutrophils Relative % 61 %   Neutro Abs 4.7 1.4 - 6.5 K/uL   Lymphocytes Relative 29 %   Lymphs Abs 2.2 1.0 - 3.6 K/uL   Monocytes Relative 7 %   Monocytes Absolute 0.5 0.2 - 0.9 K/uL   Eosinophils Relative 2 %   Eosinophils Absolute 0.2 0 - 0.7 K/uL   Basophils Relative 1 %   Basophils Absolute 0.0 0 - 0.1 K/uL  Basic metabolic panel     Status: Abnormal   Collection Time: 06/01/17  3:41 PM  Result Value Ref Range   Sodium 139 135 - 145 mmol/L   Potassium 4.3 3.5 - 5.1 mmol/L   Chloride 104 101 - 111 mmol/L   CO2 26 22 - 32 mmol/L   Glucose, Bld 103 (H) 65 - 99 mg/dL   BUN 16 6 - 20 mg/dL   Creatinine, Ser 7.25 0.44 - 1.00 mg/dL   Calcium 9.2 8.9 - 36.6 mg/dL   GFR calc non Af Amer >60 >60 mL/min   GFR calc Af Amer >60 >60 mL/min   Anion gap 9 5 - 15  Urinalysis, Complete w Microscopic     Status: Abnormal   Collection Time: 06/01/17  3:41 PM  Result Value Ref Range   Color, Urine YELLOW (A) YELLOW   APPearance CLEAR (A) CLEAR   Specific Gravity, Urine 1.024 1.005 - 1.030   pH 6.0 5.0 - 8.0   Glucose, UA NEGATIVE NEGATIVE mg/dL   Hgb urine dipstick SMALL (A) NEGATIVE   Bilirubin Urine NEGATIVE NEGATIVE   Ketones, ur NEGATIVE NEGATIVE mg/dL   Protein, ur NEGATIVE  NEGATIVE mg/dL   Nitrite NEGATIVE NEGATIVE   Leukocytes, UA NEGATIVE NEGATIVE   RBC / HPF 0-5 0 - 5 RBC/hpf   WBC, UA 0-5 0 - 5 WBC/hpf   Bacteria, UA NONE SEEN NONE SEEN   Squamous Epithelial / LPF 0-5 (A) NONE SEEN   Mucous PRESENT   TSH     Status: None   Collection Time: 06/01/17  3:41 PM  Result Value Ref Range   TSH 1.128 0.350 - 4.500 uIU/mL  Pregnancy, urine POC     Status: None   Collection Time: 06/01/17  3:56 PM  Result Value Ref Range   Preg Test, Ur NEGATIVE NEGATIVE   ____________________________________________  ___________________________________  RADIOLOGY   ____________________________________________   PROCEDURES  Procedure(s) performed:  Procedures    Critical Care performed: no ____________________________________________   INITIAL IMPRESSION / ASSESSMENT AND PLAN / ED COURSE  Pertinent labs & imaging results that were available during my care of the patient were reviewed by me and considered in my medical decision making (see chart for details).  DDX: migraine, tension, cluster, occipital neuralgia, unlikely meningitis  Aura Feylison M Greene is a 26 y.o. who presents to the ED with with Hx of HA for last 2 weeks. Not worst HA ever. Gradual onset.  Denies focal neurologic symptoms. Denies trauma. No fevers.  + neck pain but reproducible with palpation. No vision deficits at this time.. Afebrile in ED. VSS. Exam as above. No meningeal signs. No CN, motor, sensory or cerebellar deficits. Temporal arteries palpable and non-tender. Appears well and non-toxic.  Will provide IV fluids for hydration and IV medications for symptom control.  Likely tension, non-specific or possible migraine HA. Clinical picture is not consistent with ICH, SAH, SDH, EDH, TIA, or CVA. No concern for meningitis or encephalitis. No concern for GCA/Temporal arteritis.     Clinical Course as of Jun 01 1712  Thu Jun 01, 2017  1633 Blood work reassuring.  Negative preg  [PR]      Clinical Course User Index [PR] Willy Eddyobinson, Lynell Kussman, MD   ----------------------------------------- 5:13 PM on 06/01/2017 -----------------------------------------   Pain improved. Repeat neuro exam is again without focal deficit, nuchal rigidity or evidence of meningeal irritation.  Stable to D/C home, follow up with PCP or Neurology if persistent recurrent Has.  Have discussed with the patient and available family all diagnostics and treatments performed thus far and all questions were answered to the best of my ability. The patient demonstrates understanding and agreement with plan.  ____________________________________________   FINAL CLINICAL IMPRESSION(S) / ED DIAGNOSES  Final diagnoses:  Acute nonintractable headache, unspecified headache type      NEW MEDICATIONS STARTED DURING THIS VISIT:  New Prescriptions   No medications on file     Note:  This document was prepared using Dragon voice recognition software and may include unintentional dictation errors.    Willy Eddyobinson, Beckhem Isadore, MD 06/01/17 [phone]

## 2017-06-09 ENCOUNTER — Other Ambulatory Visit: Payer: Self-pay | Admitting: Nurse Practitioner

## 2017-06-09 DIAGNOSIS — R519 Headache, unspecified: Secondary | ICD-10-CM

## 2017-06-09 DIAGNOSIS — R51 Headache: Principal | ICD-10-CM

## 2017-06-14 ENCOUNTER — Ambulatory Visit
Admission: RE | Admit: 2017-06-14 | Discharge: 2017-06-14 | Disposition: A | Discharge status: Home / Self Care | Payer: Commercial Managed Care - HMO | Source: Ambulatory Visit | Attending: Nurse Practitioner | Admitting: Nurse Practitioner

## 2017-06-14 DIAGNOSIS — R930 Abnormal findings on diagnostic imaging of skull and head, not elsewhere classified: Secondary | ICD-10-CM | POA: Insufficient documentation

## 2017-06-14 DIAGNOSIS — R51 Headache: Secondary | ICD-10-CM | POA: Diagnosis present

## 2017-06-14 DIAGNOSIS — R519 Headache, unspecified: Secondary | ICD-10-CM

## 2017-06-14 MED ORDER — GADOBENATE DIMEGLUMINE 529 MG/ML IV SOLN
10.0000 mL | Freq: Once | INTRAVENOUS | Status: AC | PRN
  Administered 2017-06-14: 10 mL via INTRAVENOUS

## 2018-06-08 ENCOUNTER — Encounter: Payer: Self-pay | Admitting: Certified Nurse Midwife

## 2018-06-08 ENCOUNTER — Ambulatory Visit (INDEPENDENT_AMBULATORY_CARE_PROVIDER_SITE_OTHER): Payer: 59 | Admitting: Certified Nurse Midwife

## 2018-06-08 ENCOUNTER — Other Ambulatory Visit (HOSPITAL_COMMUNITY)
Admission: RE | Admit: 2018-06-08 | Discharge: 2018-06-08 | Disposition: A | Discharge status: Home / Self Care | Payer: 59 | Source: Ambulatory Visit | Attending: Obstetrics and Gynecology | Admitting: Obstetrics and Gynecology

## 2018-06-08 VITALS — BP 103/68 | HR 91 | Ht 66.0 in | Wt 132.3 lb

## 2018-06-08 DIAGNOSIS — Z01419 Encounter for gynecological examination (general) (routine) without abnormal findings: Secondary | ICD-10-CM | POA: Diagnosis present

## 2018-06-08 DIAGNOSIS — E049 Nontoxic goiter, unspecified: Secondary | ICD-10-CM | POA: Diagnosis not present

## 2018-06-08 NOTE — Patient Instructions (Signed)
Preventing Cervical Cancer Cervical cancer is cancer that grows on the cervix. The cervix is at the bottom of the uterus. It connects the uterus to the vagina. The uterus is where a baby develops during pregnancy. Cancer occurs when cells become abnormal and start to grow out of control. Cervical cancer grows slowly and may not cause any symptoms at first. Over time, the cancer can grow deep into the cervix tissue and spread to other areas. If it is found early, cervical cancer can be treated effectively. You can also take steps to prevent this type of cancer. Most cases of cervical cancer are caused by an STI (sexually transmitted infection) called human papillomavirus (HPV). One way to reduce your risk of cervical cancer is to avoid infection w Preventive Care 18-39 Years, Female Preventive care refers to lifestyle choices and visits with your health care provider that can promote health and wellness. What does preventive care include?  A yearly physical exam. This is also called an annual well check.  Dental exams once or twice a year.  Routine eye exams. Ask your health care provider how often you should have your eyes checked.  Personal lifestyle choices, including: ? Daily care of your teeth and gums. ? Regular physical activity. ? Eating a healthy diet. ? Avoiding tobacco and drug use. ? Limiting alcohol use. ? Practicing safe sex. ? Taking vitamin and mineral supplements as recommended by your health care provider. What happens during an annual well check? The services and screenings done by your health care provider during your annual well check will depend on your age, overall health, lifestyle risk factors, and family history of disease. Counseling Your health care provider may ask you questions about your:  Alcohol use.  Tobacco use.  Drug use.  Emotional well-being.  Home and relationship well-being.  Sexual activity.  Eating habits.  Work and work  Statistician.  Method of birth control.  Menstrual cycle.  Pregnancy history.  Screening You may have the following tests or measurements:  Height, weight, and BMI.  Diabetes screening. This is done by checking your blood sugar (glucose) after you have not eaten for a while (fasting).  Blood pressure.  Lipid and cholesterol levels. These may be checked every 5 years starting at age 79.  Skin check.  Hepatitis C blood test.  Hepatitis B blood test.  Sexually transmitted disease (STD) testing.  BRCA-related cancer screening. This may be done if you have a family history of breast, ovarian, tubal, or peritoneal cancers.  Pelvic exam and Pap test. This may be done every 3 years starting at age 25. Starting at age 25, this may be done every 5 years if you have a Pap test in combination with an HPV test.  Discuss your test results, treatment options, and if necessary, the need for more tests with your health care provider. Vaccines Your health care provider may recommend certain vaccines, such as:  Influenza vaccine. This is recommended every 25 year  Tetanus, diphtheria, and acellular pertussis (Tdap, Td) vaccine. You may need a Td booster every 10 years.  Varicella vaccine. You may need this if you have not been vaccinated.  HPV vaccine. If you are 25 or younger, you may need three doses over 6 months.  Measles, mumps, and rubella (MMR) vaccine. You may need at least one dose of MMR. You may also need a second dose.  Pneumococcal 13-valent conjugate (PCV13) vaccine. You may need this if you have certain conditions and were not previously vaccinated.  Pneumococcal polysaccharide (PPSV23) vaccine. You may need one or two doses if you smoke cigarettes or if you have certain conditions.  Meningococcal vaccine. One dose is recommended if you are age 80-21 years and a first-year college student living in a residence hall, or if you have one of several medical conditions. You may  also need additional booster doses.  Hepatitis A vaccine. You may need this if you have certain conditions or if you travel or work in places where you may be exposed to hepatitis A.  Hepatitis B vaccine. You may need this if you have certain conditions or if you travel or work in places where you may be exposed to hepatitis B.  Haemophilus influenzae type b (Hib) vaccine. You may need this if you have certain risk factors.  Talk to your health care provider about which screenings and vaccines you need and how often you need them. This information is not intended to replace advice given to you by your health care provider. Make sure you discuss any questions you have with your health care provider. Document Released: 01/03/2002 Document Revised: 07/27/2016 Document Reviewed: 09/08/2015 Elsevier Interactive Patient Education  2018 [employer]. HPV (Human Papillomavirus) Vaccine: What You Need to Know 1. Why get vaccinated? HPV vaccine prevents infection with human papillomavirus (HPV) types that are associated with many cancers, including:  cervical cancer in females,  vaginal and vulvar cancers in females,  anal cancer in females and males,  throat cancer in females and males, and  penile cancer in males.  In addition, HPV vaccine prevents infection with HPV types that cause genital warts in both females and males. In the [place] about 12,000 women get cervical cancer every year, and about 4,000 women die from it. HPV vaccine can prevent most of these cases of cervical cancer. Vaccination is not a substitute for cervical cancer screening. This vaccine does not protect against all HPV types that can cause cervical cancer. Women should still get regular Pap tests. HPV infection usually comes from sexual contact, and most people will become infected at some point in their life. About 14 million Americans, including teens, get infected every year. Most infections will go away on their own  and not cause serious problems. But thousands of women and men get cancer and other diseases from HPV. 2. HPV vaccine HPV vaccine is approved by FDA and is recommended by CDC for both males and females. It is routinely given at 25 or 25 years of age, but it may be given beginning at age 69 years through age 16 years. Most adolescents 9 through 25 years of age should get HPV vaccine as a two-dose series with the doses separated by 6-12 months. People who start HPV vaccination at 21 years of age and older should get the vaccine as a three-dose series with the second dose given 1-2 months after the first dose and the third dose given 6 months after the first dose. There are several exceptions to these age recommendations. Your health care provider can give you more information. 3. Some people should not get this vaccine  Anyone who has had a severe (life-threatening) allergic reaction to a dose of HPV vaccine should not get another dose.  Anyone who has a severe (life threatening) allergy to any component of HPV vaccine should not get the vaccine.  Tell your doctor if you have any severe allergies that you know of, including a severe allergy to yeast.  HPV vaccine is not recommended for pregnant  women. If you learn that you were pregnant when you were vaccinated, there is no reason to expect any problems for you or your baby. Any woman who learns she was pregnant when she got HPV vaccine is encouraged to contact the manufacturer's registry for HPV vaccination during pregnancy at [phone]. Women who are breastfeeding may be vaccinated.  If you have a mild illness, such as a cold, you can probably get the vaccine today. If you are moderately or severely ill, you should probably wait until you recover. Your doctor can advise you. 4. Risks of a vaccine reaction With any medicine, including vaccines, there is a chance of side effects. These are usually mild and go away on their own, but serious  reactions are also possible. Most people who get HPV vaccine do not have any serious problems with it. Mild or moderate problems following HPV vaccine:  Reactions in the arm where the shot was given: ? Soreness (about 9 people in 10) ? Redness or swelling (about 1 person in 3)  Fever: ? Mild (100F) (about 1 person in 10) ? Moderate (102F) (about 1 person in 26)  Other problems: ? Headache (about 1 person in 3) Problems that could happen after any injected vaccine:  People sometimes faint after a medical procedure, including vaccination. Sitting or lying down for about 15 minutes can help prevent fainting, and injuries caused by a fall. Tell your doctor if you feel dizzy, or have vision changes or ringing in the ears.  Some people get severe pain in the shoulder and have difficulty moving the arm where a shot was given. This happens very rarely.  Any medication can cause a severe allergic reaction. Such reactions from a vaccine are very rare, estimated at about 1 in a million doses, and would happen within a few minutes to a few hours after the vaccination. As with any medicine, there is a very remote chance of a vaccine causing a serious injury or death. The safety of vaccines is always being monitored. For more information, visit: http://www.aguilar.org/. 5. What if there is a serious reaction? What should I look for? Look for anything that concerns you, such as signs of a severe allergic reaction, very high fever, or unusual behavior. Signs of a severe allergic reaction can include hives, swelling of the face and throat, difficulty breathing, a fast heartbeat, dizziness, and weakness. These would usually start a few minutes to a few hours after the vaccination. What should I do? If you think it is a severe allergic reaction or other emergency that can't wait, call 9-1-1 or get to the nearest hospital. Otherwise, call your doctor. Afterward, the reaction should be reported to the  Vaccine Adverse Event Reporting System (VAERS). Your doctor should file this report, or you can do it yourself through the VAERS web site at www.vaers.SamedayNews.es, or by calling (540)020-8445. VAERS does not give medical advice. 6. The National Vaccine Injury Compensation Program The [employer] Vaccine Injury Compensation Program (VICP) is a federal program that was created to compensate people who may have been injured by certain vaccines. Persons who believe they may have been injured by a vaccine can learn about the program and about filing a claim by calling (854) 039-1335 or visiting the [place] website at GoldCloset.com.ee. There is a time limit to file a claim for compensation. 7. How can I learn more?  Ask your health care provider. He or she can give you the vaccine package insert or suggest other sources of information.  Call your local or state health department.  Contact the Centers for Disease Control and Prevention (CDC): ? Call 539 548 8246 (1-800-CDC-INFO) or ? Visit CDC's website at http://sweeney-todd.com/ Vaccine Information Statement, HPV Vaccine (10/23/2015) This information is not intended to replace advice given to you by your health care provider. Make sure you discuss any questions you have with your health care provider. Document Released: 06/04/2014 Document Revised: 07/28/2016 Document Reviewed: 07/28/2016 Elsevier Interactive Patient Education  2017 [place]. ith the HPV virus. You can do this by practicing safe sex and by getting the HPV vaccine. Getting regular Pap tests is also important because this can help identify changes in cells that could lead to cancer. Your chances of getting this disease can also be reduced by making certain lifestyle changes. How can I protect myself from cervical cancer? Preventing HPV infection  Ask your health care provider about getting the HPV vaccine. If you are 45 years old or younger, you may need to get this vaccine,  which is given in three doses over 6 months. This vaccine protects against the types of HPV that could cause cancer.  Limit the number of people you have sex with. Also avoid having sex with people who have had many sex partners.  Use a latex condom during sex. Getting Pap tests  Get Pap tests regularly, starting at age 22. Talk with your health care provider about how often you need these tests. ? Most women who are 24?25 years of age should have a Pap test every 3 years. ? Most women who are 90?25 years of age should have a Pap test in combination with an HPV test every 5 years. ? Women with a higher risk of cervical cancer, such as those with a weakened immune system or those who have been exposed to the drug diethylstilbestrol (DES), may need more frequent testing. Making other lifestyle changes  Do not use any products that contain nicotine or tobacco, such as cigarettes and e-cigarettes. If you need help quitting, ask your health care provider.  Eat at least 5 servings of fruits and vegetables every day.  Lose weight if you are overweight. Why are these changes important?  These changes and screening tests are designed to address the factors that are known to increase the risk of cervical cancer. Taking these steps is the best way to reduce your risk.  Having regular Pap tests will help identify changes in cells that could lead to cancer. Steps can then be taken to prevent cancer from developing.  These changes will also help find cervical cancer early. This type of cancer can be treated effectively if it is found early. It can be more dangerous and difficult to treat if cancer has grown deep into your cervix or has spread.  In addition to making you less likely to get cervical cancer, these changes will also provide other health benefits, such as the following: ? Practicing safe sex is important for preventing STIs and unplanned pregnancies. ? Avoiding tobacco can reduce your risk  for other cancers and health issues. ? Eating a healthy diet and maintaining a healthy weight are good for your overall health. What can happen if changes are not made? In the early stages, cervical cancer might not have any symptoms. It can take many years for the cancer to grow and get deep into the cervix tissue. This may be happening without you knowing about it. If you develop any symptoms, such as pelvic pain or unusual discharge or bleeding  from your vagina, you should see your health care provider right away. If cervical cancer is not found early, you might need treatments such as radiation, chemotherapy, or surgery. In some cases, surgery may mean that you will not be able to get pregnant or carry a pregnancy to term. Where to find support: Talk with your health care provider, school nurse, or local health department for guidance about screening and vaccination. Some children and teens may be able to get the HPV vaccine free of charge through the U.S. government's Vaccines for Children [facility]) program. Other places that provide vaccinations include:  Public health clinics. Check with your local health department.  [place], where you would pay only what you can afford. To find one near you, check this website: http://lyons.com/  [place]. These are part of a program for Medicare and Medicaid patients who live in rural areas.  The National Breast and Cervical Cancer Early Detection Program also provides breast and cervical cancer screenings and diagnostic services to low-income, uninsured, and underinsured women. Cervical cancer can be passed down through families. Talk with your health care provider or genetic counselor to learn more about genetic testing for cancer. Where to find more information: Learn more about cervical cancer from:  [employer] of Gynecology: WirelessShades.ch  American Cancer Society:  www.cancer.org/cancer/cervicalcancer/  U.S. Centers for Disease Control and Prevention: ParisianParasols.gl  Summary  Talk with your health care provider about getting the HPV vaccine.  Be sure to get regular Pap tests as recommended by your health care provider.  See your health care provider right away if you have any pelvic pain or unusual discharge or bleeding from your vagina. This information is not intended to replace advice given to you by your health care provider. Make sure you discuss any questions you have with your health care provider. Document Released: 11/22/2015 Document Revised: 07/05/2016 Document Reviewed: 07/05/2016 Elsevier Interactive Patient Education  [employer].

## 2018-06-08 NOTE — Progress Notes (Signed)
GYNECOLOGY ANNUAL PREVENTATIVE CARE ENCOUNTER NOTE  Subjective:   Judith Chapman is a 27 y.o. No obstetric history on file. female here for a routine annual gynecologic exam.  Current complaints: none.   Denies abnormal vaginal bleeding, discharge, pelvic pain, problems with intercourse or other gynecologic concerns. States that she maybe considering pregnancy and asks about preconception health.    Gynecologic History Patient's last menstrual period was 05/23/2018 (exact date). Contraception: OCP (estrogen/progesterone) Junel Last Pap: has never had.  Last mammogram: N/A.   Obstetric History OB History  No data available    Past Medical History:  Diagnosis Date  . Cystitis, interstitial   . Frequent headaches   . Pelvic pain in female     Past Surgical History:  Procedure Laterality Date  . CYSTO WITH HYDRODISTENSION N/A 08/27/2015   Procedure: CYSTOSCOPY HYDRODISTENSION AND INSTTILLATION OF MARCAINE AND PRYDIUM ;  Surgeon: Alfredo Martinez, MD;  Location: [facility];  Service: Urology;  Laterality: N/A;  . CYSTO/  HYDRODISTENTION/  BLADDER BX  02-28-2012  . TONSILLECTOMY    . WISDOM TOOTH EXTRACTION      Current Outpatient Medications on File Prior to Visit  Medication Sig Dispense Refill  . ibuprofen (ADVIL,MOTRIN) 200 MG tablet Take 800 mg by mouth every 6 (six) hours as needed for moderate pain.    . Magnesium Gluconate 550 MG TABS Take by mouth.    . norethindrone-ethinyl estradiol-iron (MICROGESTIN FE,GILDESS FE,LOESTRIN FE) 1.5-30 MG-MCG tablet Take 1 tablet by mouth daily.    . nortriptyline (PAMELOR) 10 MG capsule Take 10 mg by mouth at bedtime.    . vitamin B-12 (CYANOCOBALAMIN) 100 MCG tablet Take 100 mcg by mouth daily.     No current facility-administered medications on file prior to visit.     Allergies  Allergen Reactions  . Doxycycline Anaphylaxis  . Morphine And Related Other (See Comments)    Throat swelling  . Augmentin  [Amoxicillin-Pot Clavulanate] Nausea And Vomiting  . Sulfa Antibiotics Rash    Social History:  reports that she has never smoked. She has quit using smokeless tobacco. She reports that she drinks alcohol. She reports that she does not use drugs.  History reviewed. No pertinent family history.  The following portions of the patient's history were reviewed and updated as appropriate: allergies, current medications, past family history, past medical history, past social history, past surgical history and problem list.  Review of Systems Pertinent items noted in HPI and remainder of comprehensive ROS otherwise negative.   Objective:  BP 103/68   Pulse 91   Ht 5\' 6"  (1.676 m)   Wt 132 lb 5 oz (60 kg)   LMP 05/23/2018 (Exact Date)   BMI 21.36 kg/m  CONSTITUTIONAL: Well-developed, well-nourished female in no acute distress.  HENT:  Normocephalic, atraumatic, External right and left ear normal. Oropharynx is clear and moist EYES: Conjunctivae and EOM are normal. Pupils are equal, round, and reactive to light. No scleral icterus.  NECK: Normal range of motion, supple, no masses.  Thyroid enlarged on right side SKIN: Skin is warm and dry. No rash noted. Not diaphoretic. No erythema. No pallor. MUSCULOSKELETAL: Normal range of motion. No tenderness.  No cyanosis, clubbing, or edema.  2+ distal pulses. NEUROLOGIC: Alert and oriented to person, place, and time. Normal reflexes, muscle tone coordination. No cranial nerve deficit noted. PSYCHIATRIC: Normal mood and affect. Normal behavior. Normal judgment and thought content. CARDIOVASCULAR: Normal heart rate noted, regular rhythm RESPIRATORY: Clear to auscultation bilaterally. Effort  and breath sounds normal, no problems with respiration noted. BREASTS: Symmetric in size. No masses, skin changes, nipple drainage, or lymphadenopathy. ABDOMEN: Soft, normal bowel sounds, no distention noted.  No tenderness, rebound or guarding.  PELVIC: Normal  appearing external genitalia; normal appearing vaginal mucosa and cervix.  No abnormal discharge noted.  Pap smear obtained.Contact bleeding  Normal uterine size, no other palpable masses, no uterine or adnexal tenderness.    Assessment and Plan:  Well Women Exam Will follow up results of pap smear and manage accordingly. Labs: lipid profile and thyroid panel with TSH Mammogram n/a  Preconception recommendations reviewed with pt.  Routine preventative health maintenance measures emphasized. Please refer to After Visit Summary for other counseling recommendations.    Doreene BurkeAnnie Estevan Kersh, CNM

## 2018-06-09 LAB — LIPID PANEL
CHOL/HDL RATIO: 4.8 ratio — AB (ref 0.0–4.4)
Cholesterol, Total: 178 mg/dL (ref 100–199)
HDL: 37 mg/dL — ABNORMAL LOW (ref >39)
LDL Calculated: 127 mg/dL — ABNORMAL HIGH (ref 0–99)
Triglycerides: 69 mg/dL (ref 0–149)
VLDL Cholesterol Cal: 14 mg/dL (ref 5–40)

## 2018-06-09 LAB — THYROID PANEL WITH TSH
FREE THYROXINE INDEX: 2.3 (ref 1.2–4.9)
T3 UPTAKE RATIO: 24 % (ref 24–39)
T4 TOTAL: 9.4 ug/dL (ref 4.5–12.0)
TSH: 1.32 u[IU]/mL (ref 0.450–4.500)

## 2018-06-11 ENCOUNTER — Encounter (INDEPENDENT_AMBULATORY_CARE_PROVIDER_SITE_OTHER): Payer: Self-pay

## 2018-06-11 ENCOUNTER — Telehealth: Payer: Self-pay | Admitting: Certified Nurse Midwife

## 2018-06-11 ENCOUNTER — Telehealth: Payer: Self-pay

## 2018-06-11 LAB — CYTOLOGY - PAP: DIAGNOSIS: NEGATIVE

## 2018-06-11 NOTE — Telephone Encounter (Signed)
The patient called wanting to speak with a nurse in regards to her wanting to have her test results explained to her. Please advise.

## 2018-06-11 NOTE — Telephone Encounter (Signed)
Informed pt of lab results per AT. Info on cholesterol sent to pt per request.

## 2018-06-12 ENCOUNTER — Telehealth: Payer: Self-pay

## 2018-06-12 NOTE — Telephone Encounter (Signed)
Returned pts call- she wanted her lipid results. Given. No further questions

## 2018-07-09 ENCOUNTER — Telehealth: Payer: Self-pay | Admitting: Certified Nurse Midwife

## 2018-07-09 ENCOUNTER — Other Ambulatory Visit: Payer: Self-pay

## 2018-07-09 MED ORDER — NORETHIN ACE-ETH ESTRAD-FE 1.5-30 MG-MCG PO TABS: 1.0000 | ORAL_TABLET | Freq: Every day | ORAL | 11 refills | Status: DC

## 2018-07-09 NOTE — Telephone Encounter (Signed)
Patient called stating the script for B/C was never called in. She states it was supposed to be for Junel. [facility] uses [facility] on [address] in [place]. Thanks

## 2019-06-10 ENCOUNTER — Encounter: Payer: Self-pay | Admitting: Certified Nurse Midwife

## 2019-06-10 ENCOUNTER — Ambulatory Visit (INDEPENDENT_AMBULATORY_CARE_PROVIDER_SITE_OTHER): Payer: 59 | Admitting: Certified Nurse Midwife

## 2019-06-10 ENCOUNTER — Other Ambulatory Visit: Payer: Self-pay

## 2019-06-10 VITALS — BP 94/67 | HR 75 | Ht 66.0 in | Wt 130.1 lb

## 2019-06-10 DIAGNOSIS — R350 Frequency of micturition: Secondary | ICD-10-CM | POA: Diagnosis not present

## 2019-06-10 DIAGNOSIS — Z8742 Personal history of other diseases of the female genital tract: Secondary | ICD-10-CM

## 2019-06-10 LAB — POCT URINALYSIS DIPSTICK
Bilirubin, UA: NEGATIVE
Blood, UA: NEGATIVE
Glucose, UA: NEGATIVE
Ketones, UA: NEGATIVE
Leukocytes, UA: NEGATIVE
Nitrite, UA: POSITIVE
Protein, UA: NEGATIVE
Spec Grav, UA: 1.025 (ref 1.010–1.025)
Urobilinogen, UA: 0.2 E.U./dL
pH, UA: 7 (ref 5.0–8.0)

## 2019-06-10 MED ORDER — KETOROLAC TROMETHAMINE 10 MG PO TABS: 10.0000 mg | ORAL_TABLET | Freq: Four times a day (QID) | ORAL | 0 refills | Status: DC | PRN

## 2019-06-10 MED ORDER — NORETHIN ACE-ETH ESTRAD-FE 1.5-30 MG-MCG PO TABS: 1.0000 | ORAL_TABLET | Freq: Every day | ORAL | 11 refills | Status: DC

## 2019-06-10 NOTE — Addendum Note (Signed)
Addended by: Raliegh Ip on: 06/10/2019 08:55 AM   Modules accepted: Orders

## 2019-06-10 NOTE — Progress Notes (Signed)
GYNECOLOGY ANNUAL PREVENTATIVE CARE ENCOUNTER NOTE  History:     Judith Chapman is a 28 y.o. No obstetric history on file. female here for a routine annual gynecologic exam.  Current complaints: fatigue and urinary frequency.  Ovarian cysts- state she has history and she notices every few months a sharp right pelvic pain that cause her to double over. She will experience this pain for several hours then it will subside.   Denies abnormal vaginal bleeding, discharge, pelvic pain, problems with intercourse or other gynecologic concerns.    Gynecologic History Patient's last menstrual period was 06/05/2019 (exact date). Contraception: OCP (estrogen/progesterone) Last Pap: 06/08/18. Results were: normal  Last mammogram: n/a  Obstetric History OB History  Gravida Para Term Preterm AB Living  0 0 0 0 0 0  SAB TAB Ectopic Multiple Live Births  0 0 0 0 0    Past Medical History:  Diagnosis Date  . Cystitis, interstitial   . Frequent headaches   . Pelvic pain in female     Past Surgical History:  Procedure Laterality Date  . CYSTO WITH HYDRODISTENSION N/A 08/27/2015   Procedure: CYSTOSCOPY HYDRODISTENSION AND INSTTILLATION OF MARCAINE AND PRYDIUM ;  Surgeon: Bjorn Loser, MD;  Location: [facility];  Service: Urology;  Laterality: N/A;  . CYSTO/  HYDRODISTENTION/  BLADDER BX  02-28-2012  . TONSILLECTOMY    . WISDOM TOOTH EXTRACTION      Current Outpatient Medications on File Prior to Visit  Medication Sig Dispense Refill  . ibuprofen (ADVIL,MOTRIN) 200 MG tablet Take 800 mg by mouth every 6 (six) hours as needed for moderate pain.    . nortriptyline (PAMELOR) 10 MG capsule Take 10 mg by mouth at bedtime.    . SUMAtriptan (IMITREX) 100 MG tablet TAKE 1 TABLET BY MOUTH AS NEEDED FOR HEADACHE. MAY REPEAT ONCE IN 2 HOURS IF NEEDED    . vitamin B-12 (CYANOCOBALAMIN) 100 MCG tablet Take 100 mcg by mouth daily.    . Magnesium Gluconate 550 MG TABS Take by mouth.      No current facility-administered medications on file prior to visit.     Allergies  Allergen Reactions  . Doxycycline Anaphylaxis  . Morphine And Related Other (See Comments)    Throat swelling  . Augmentin [Amoxicillin-Pot Clavulanate] Nausea And Vomiting  . Sulfa Antibiotics Rash    Social History:  reports that she has never smoked. She has quit using smokeless tobacco. She reports current alcohol use. She reports that she does not use drugs.  She works out once a week for 45 min. She states her activity decreased due to covid. Is not able to go to gym and stays at home for work. She has noticed an increase in fatigue.   No family history on file.  The following portions of the patient's history were reviewed and updated as appropriate: allergies, current medications, past family history, past medical history, past social history, past surgical history and problem list.  Review of Systems Pertinent items noted in HPI and remainder of comprehensive ROS otherwise negative.  Physical Exam:  BP 94/67   Pulse 75   Ht '5\' 6"'  (1.676 m)   Wt 130 lb 1 oz (59 kg)   LMP 06/05/2019 (Exact Date)   BMI 20.99 kg/m  CONSTITUTIONAL: Well-developed, well-nourished female in no acute distress.  HENT:  Normocephalic, atraumatic, External right and left ear normal. Oropharynx is clear and moist EYES: Conjunctivae and EOM are normal. Pupils are equal, round, and reactive  to light. No scleral icterus.  NECK: Normal range of motion, supple, no masses.  Normal thyroid.  SKIN: Skin is warm and dry. No rash noted. Not diaphoretic. No erythema. No pallor. MUSCULOSKELETAL: Normal range of motion. No tenderness.  No cyanosis, clubbing, or edema.  2+ distal pulses. NEUROLOGIC: Alert and oriented to person, place, and time. Normal reflexes, muscle tone coordination. No cranial nerve deficit noted. PSYCHIATRIC: Normal mood and affect. Normal behavior. Normal judgment and thought content. CARDIOVASCULAR:  Normal heart rate noted, regular rhythm RESPIRATORY: Clear to auscultation bilaterally. Effort and breath sounds normal, no problems with respiration noted. BREASTS: Symmetric in size. No masses, skin changes, nipple drainage, or lymphadenopathy. ABDOMEN: Soft, normal bowel sounds, no distention noted.  No tenderness, rebound or guarding.  PELVIC: Normal appearing external genitalia; normal appearing vaginal mucosa and cervix.  No abnormal discharge noted.  Pap smear not indicated .  Normal uterine size, no other palpable masses, no uterine or adnexal tenderness.   Assessment and Plan:    Annual Well Women GYN exam Pap smear not indicated  Mammogram N/A Labs: Declines CBC will call if she would like to try some self help measure to improve fatigue first. Had TSH last year.  Refill for OCPs and toradol ordered for ovarian cysts.  Routine preventative health maintenance measures emphasized.She is considering becoming pregnant. Encouraged healthy lifestyle and start of PNV when she starts trying. Discussed app and ovulation kit for timing of intercourse.  Please refer to After Visit Summary for other counseling recommendations.      Philip Aspen, CNM

## 2019-06-10 NOTE — Patient Instructions (Signed)
Preventive Care 21-26 Years Old, Female Preventive care refers to visits with your health care provider and lifestyle choices that can promote health and wellness. This includes:  A yearly physical exam. This may also be called an annual well check.  Regular dental visits and eye exams.  Immunizations.  Screening for certain conditions.  Healthy lifestyle choices, such as eating a healthy diet, getting regular exercise, not using drugs or products that contain nicotine and tobacco, and limiting alcohol use. What can I expect for my preventive care visit? Physical exam Your health care provider will check your:  Height and weight. This may be used to calculate body mass index (BMI), which tells if you are at a healthy weight.  Heart rate and blood pressure.  Skin for abnormal spots. Counseling Your health care provider may ask you questions about your:  Alcohol, tobacco, and drug use.  Emotional well-being.  Home and relationship well-being.  Sexual activity.  Eating habits.  Work and work environment.  Method of birth control.  Menstrual cycle.  Pregnancy history. What immunizations do I need?  Influenza (flu) vaccine  This is recommended every year. Tetanus, diphtheria, and pertussis (Tdap) vaccine  You may need a Td booster every 10 years. Varicella (chickenpox) vaccine  You may need this if you have not been vaccinated. Human papillomavirus (HPV) vaccine  If recommended by your health care provider, you may need three doses over 6 months. Measles, mumps, and rubella (MMR) vaccine  You may need at least one dose of MMR. You may also need a second dose. Meningococcal conjugate (MenACWY) vaccine  One dose is recommended if you are age 19-21 years and a first-year college student living in a residence hall, or if you have one of several medical conditions. You may also need additional booster doses. Pneumococcal conjugate (PCV13) vaccine  You may need  this if you have certain conditions and were not previously vaccinated. Pneumococcal polysaccharide (PPSV23) vaccine  You may need one or two doses if you smoke cigarettes or if you have certain conditions. Hepatitis A vaccine  You may need this if you have certain conditions or if you travel or work in places where you may be exposed to hepatitis A. Hepatitis B vaccine  You may need this if you have certain conditions or if you travel or work in places where you may be exposed to hepatitis B. Haemophilus influenzae type b (Hib) vaccine  You may need this if you have certain conditions. You may receive vaccines as individual doses or as more than one vaccine together in one shot (combination vaccines). Talk with your health care provider about the risks and benefits of combination vaccines. What tests do I need?  Blood tests  Lipid and cholesterol levels. These may be checked every 5 years starting at age 20.  Hepatitis C test.  Hepatitis B test. Screening  Diabetes screening. This is done by checking your blood sugar (glucose) after you have not eaten for a while (fasting).  Sexually transmitted disease (STD) testing.  BRCA-related cancer screening. This may be done if you have a family history of breast, ovarian, tubal, or peritoneal cancers.  Pelvic exam and Pap test. This may be done every 3 years starting at age 21. Starting at age 30, this may be done every 5 years if you have a Pap test in combination with an HPV test. Talk with your health care provider about your test results, treatment options, and if necessary, the need for more tests.   Follow these instructions at home: Eating and drinking   Eat a diet that includes fresh fruits and vegetables, whole grains, lean protein, and low-fat dairy.  Take vitamin and mineral supplements as recommended by your health care provider.  Do not drink alcohol if: ? Your health care provider tells you not to drink. ? You are  pregnant, may be pregnant, or are planning to become pregnant.  If you drink alcohol: ? Limit how much you have to 0-1 drink a day. ? Be aware of how much alcohol is in your drink. In the [place] one drink equals one 12 oz bottle of beer (355 mL), one 5 oz glass of wine (148 mL), or one 1 oz glass of hard liquor (44 mL). Lifestyle  Take daily care of your teeth and gums.  Stay active. Exercise for at least 30 minutes on 5 or more days each week.  Do not use any products that contain nicotine or tobacco, such as cigarettes, e-cigarettes, and chewing tobacco. If you need help quitting, ask your health care provider.  If you are sexually active, practice safe sex. Use a condom or other form of birth control (contraception) in order to prevent pregnancy and STIs (sexually transmitted infections). If you plan to become pregnant, see your health care provider for a preconception visit. What's next?  Visit your health care provider once a year for a well check visit.  Ask your health care provider how often you should have your eyes and teeth checked.  Stay up to date on all vaccines. This information is not intended to replace advice given to you by your health care provider. Make sure you discuss any questions you have with your health care provider. Document Released: 01/03/2002 Document Revised: 07/19/2018 Document Reviewed: 07/19/2018 Elsevier Patient Education  2020 [employer]vier Inc[employer]  

## 2019-06-12 ENCOUNTER — Other Ambulatory Visit: Payer: Self-pay | Admitting: Certified Nurse Midwife

## 2019-06-12 LAB — URINE CULTURE

## 2019-06-12 MED ORDER — NITROFURANTOIN MONOHYD MACRO 100 MG PO CAPS: 100.0000 mg | ORAL_CAPSULE | Freq: Two times a day (BID) | ORAL | 0 refills | Status: AC

## 2019-06-12 NOTE — Progress Notes (Signed)
Urine culture positive for UTI orders placed for treatment. Pt notified via my chart.   Philip Aspen, CNM

## 2020-04-27 ENCOUNTER — Other Ambulatory Visit: Payer: Self-pay | Admitting: Certified Nurse Midwife

## 2020-06-10 ENCOUNTER — Encounter: Payer: 59 | Admitting: Certified Nurse Midwife

## 2021-01-20 ENCOUNTER — Other Ambulatory Visit (HOSPITAL_COMMUNITY): Payer: Self-pay | Admitting: Internal Medicine

## 2021-01-20 DIAGNOSIS — M303 Mucocutaneous lymph node syndrome [Kawasaki]: Secondary | ICD-10-CM

## 2021-02-01 ENCOUNTER — Telehealth (HOSPITAL_COMMUNITY): Payer: Self-pay | Admitting: Emergency Medicine

## 2021-02-01 NOTE — Telephone Encounter (Signed)
Pt returning phone call regarding upcoming cardiac imaging study; pt verbalizes understanding of appt date/time, parking situation and where to check in,and verified current allergies; name and call back number provided for further questions should they arise Rockwell Alexandria RN Navigator Cardiac Imaging [facility] [phone] office [phone] cell   Pt denies claustro, denies implants [place] Dec

## 2021-02-01 NOTE — Telephone Encounter (Signed)
Attempted to call patient regarding upcoming cardiac MR appointment. Left message on voicemail with name and callback number Meha Vidrine RN Navigator Cardiac Imaging [facility] [phone] Office [phone] Cell  

## 2021-02-02 ENCOUNTER — Ambulatory Visit (HOSPITAL_COMMUNITY)
Admission: RE | Admit: 2021-02-02 | Discharge: 2021-02-02 | Disposition: A | Discharge status: Home / Self Care | Payer: 59 | Source: Ambulatory Visit | Attending: Internal Medicine | Admitting: Internal Medicine

## 2021-02-02 ENCOUNTER — Other Ambulatory Visit (HOSPITAL_COMMUNITY): Payer: Self-pay | Admitting: Internal Medicine

## 2021-02-02 ENCOUNTER — Other Ambulatory Visit: Payer: Self-pay

## 2021-02-02 DIAGNOSIS — M303 Mucocutaneous lymph node syndrome [Kawasaki]: Secondary | ICD-10-CM

## 2021-02-02 MED ORDER — GADOBUTROL 1 MMOL/ML IV SOLN
7.0000 mL | Freq: Once | INTRAVENOUS | Status: AC | PRN
  Administered 2021-02-02: 7 mL via INTRAVENOUS

## 2021-09-23 DIAGNOSIS — O0993 Supervision of high risk pregnancy, unspecified, third trimester: Secondary | ICD-10-CM | POA: Insufficient documentation

## 2021-11-21 NOTE — L&D Delivery Note (Addendum)
Delivery Note  First Stage: Labor onset: 0800 Augmentation : pitocin @ 1120 Analgesia /Anesthesia intrapartum: Fentanyl x1, epidural SROM at 0500  Second Stage: Complete dilation at 1030 Onset of pushing at 1102 FHR second stage Cat II, recurrent variable decels with good recovery between contractions  Delivery of a viable female infant 04/06/2022 at 1358 by Donato Schultz, CNM delivery of fetal head in OA position with restitution to LOP. No nuchal cord;  Left nuchal hand present. Anterior then posterior shoulders delivered easily with gentle downward traction. Baby placed on mom's chest, and attended to by peds.  Cord double clamped after cessation of pulsation, cut by FOB Cord blood sample collected   Third Stage: Placenta delivered Tomasa Blase intact with 3 VC @ 1405 Placenta disposition: discarded Uterine tone firm / bleeding scant  Left sulcal laceration identified  Anesthesia for repair: epidural Repair 2-0 Vicryl Est. Blood Loss (mL):  Complications: none  Mom to postpartum.  Baby to Couplet care / Skin to Skin.  Newborn: Birth Weight: 8lb 13oz Apgar Scores: 8, 9 Feeding planned: breast

## 2022-03-11 ENCOUNTER — Encounter
Admission: RE | Admit: 2022-03-11 | Discharge: 2022-03-11 | Disposition: A | Discharge status: Home / Self Care | Payer: BC Managed Care – PPO | Source: Ambulatory Visit | Attending: Internal Medicine | Admitting: Internal Medicine

## 2022-03-11 ENCOUNTER — Encounter: Payer: Self-pay | Admitting: Anesthesiology

## 2022-03-11 NOTE — Consult Note (Signed)
Judith Chapman was seen in preparation for future vaginal delivery with concerns of having an allergy to morphine and scoliosis. She reports receiving IV morphine and developing throat swelling that was resolved with unspecified medications. She is not aware of tolerating any other opioids. Review of her previous cystoscopy shows she received Fentanyl with no allergic symptoms documented. She was informed that an epidural contains fentanyl and has a low likelihood of allergy and okay to proceed with standard epidural brew. We discussed that in the event of cesarean section, intrathecal or epidural morphine would be avoided and alternative forms of pain control were available.  ?Her scoliosis has not been corrected and does not look severe of physical exam. She has no neurological deficits. Risks of difficult epidural and increased potential for failed epidural and spinal headache were discussed.  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 A TTE 09/2020 (normal EF), nuclear SPECT 09/2020 (normal), cMRI 01/2021 (no coronary artery aneurysm, normal EF, normal aortic root size, no prior infarct), and Holter were unremarkable. She was started on metoprolol and symptoms have improved. ? ?Nelta Numbers Anesthesiologist ?

## 2022-03-25 DIAGNOSIS — M303 Mucocutaneous lymph node syndrome [Kawasaki]: Secondary | ICD-10-CM | POA: Insufficient documentation

## 2022-04-06 ENCOUNTER — Inpatient Hospital Stay: Payer: BC Managed Care – PPO | Admitting: Anesthesiology

## 2022-04-06 ENCOUNTER — Inpatient Hospital Stay
Admission: EM | Admit: 2022-04-06 | Discharge: 2022-04-07 | DRG: 806 | Disposition: A | Discharge status: Home / Self Care | Payer: BC Managed Care – PPO | Attending: Certified Nurse Midwife | Admitting: Certified Nurse Midwife

## 2022-04-06 ENCOUNTER — Encounter: Payer: Self-pay | Admitting: Obstetrics and Gynecology

## 2022-04-06 ENCOUNTER — Other Ambulatory Visit: Payer: Self-pay

## 2022-04-06 DIAGNOSIS — O9952 Diseases of the respiratory system complicating childbirth: Secondary | ICD-10-CM | POA: Diagnosis present

## 2022-04-06 DIAGNOSIS — D62 Acute posthemorrhagic anemia: Secondary | ICD-10-CM | POA: Diagnosis not present

## 2022-04-06 DIAGNOSIS — Z7982 Long term (current) use of aspirin: Secondary | ICD-10-CM | POA: Diagnosis not present

## 2022-04-06 DIAGNOSIS — O99893 Other specified diseases and conditions complicating puerperium: Secondary | ICD-10-CM | POA: Diagnosis present

## 2022-04-06 DIAGNOSIS — O9081 Anemia of the puerperium: Secondary | ICD-10-CM | POA: Diagnosis not present

## 2022-04-06 DIAGNOSIS — Z23 Encounter for immunization: Secondary | ICD-10-CM

## 2022-04-06 DIAGNOSIS — O99892 Other specified diseases and conditions complicating childbirth: Secondary | ICD-10-CM | POA: Diagnosis present

## 2022-04-06 DIAGNOSIS — O26893 Other specified pregnancy related conditions, third trimester: Secondary | ICD-10-CM | POA: Diagnosis present

## 2022-04-06 DIAGNOSIS — J45909 Unspecified asthma, uncomplicated: Secondary | ICD-10-CM | POA: Diagnosis present

## 2022-04-06 DIAGNOSIS — O4290 Premature rupture of membranes, unspecified as to length of time between rupture and onset of labor, unspecified weeks of gestation: Principal | ICD-10-CM | POA: Diagnosis present

## 2022-04-06 DIAGNOSIS — M303 Mucocutaneous lymph node syndrome [Kawasaki]: Secondary | ICD-10-CM | POA: Diagnosis present

## 2022-04-06 DIAGNOSIS — Z3A39 39 weeks gestation of pregnancy: Secondary | ICD-10-CM

## 2022-04-06 HISTORY — DX: Unspecified asthma, uncomplicated: J45.909

## 2022-04-06 LAB — COMPREHENSIVE METABOLIC PANEL
ALT: 10 U/L (ref 0–44)
AST: 33 U/L (ref 15–41)
Albumin: 2.9 g/dL — ABNORMAL LOW (ref 3.5–5.0)
Alkaline Phosphatase: 181 U/L — ABNORMAL HIGH (ref 38–126)
Anion gap: 10 (ref 5–15)
BUN: 14 mg/dL (ref 6–20)
CO2: 20 mmol/L — ABNORMAL LOW (ref 22–32)
Calcium: 9.8 mg/dL (ref 8.9–10.3)
Chloride: 107 mmol/L (ref 98–111)
Creatinine, Ser: 0.77 mg/dL (ref 0.44–1.00)
GFR, Estimated: >60 mL/min (ref >60)
Glucose, Bld: 92 mg/dL (ref 70–99)
Potassium: 4 mmol/L (ref 3.5–5.1)
Sodium: 137 mmol/L (ref 135–145)
Total Bilirubin: 0.3 mg/dL (ref 0.3–1.2)
Total Protein: 6.6 g/dL (ref 6.5–8.1)

## 2022-04-06 LAB — CBC
HCT: 36.3 % (ref 36.0–46.0)
Hemoglobin: 12.3 g/dL (ref 12.0–15.0)
MCH: 29.4 pg (ref 26.0–34.0)
MCHC: 33.9 g/dL (ref 30.0–36.0)
MCV: 86.6 fL (ref 80.0–100.0)
Platelets: 234 10*3/uL (ref 150–400)
RBC: 4.19 MIL/uL (ref 3.87–5.11)
RDW: 14.6 % (ref 11.5–15.5)
WBC: 11.9 10*3/uL — ABNORMAL HIGH (ref 4.0–10.5)
nRBC: 0.3 % — ABNORMAL HIGH (ref 0.0–0.2)

## 2022-04-06 LAB — RPR: RPR Ser Ql: NONREACTIVE

## 2022-04-06 LAB — ABO/RH: ABO/RH(D): O POS

## 2022-04-06 LAB — TYPE AND SCREEN
ABO/RH(D): O POS
Antibody Screen: NEGATIVE

## 2022-04-06 MED ORDER — EPHEDRINE 5 MG/ML INJ: 10.0000 mg | INTRAVENOUS | Status: DC | PRN

## 2022-04-06 MED ORDER — OXYTOCIN BOLUS FROM INFUSION
333.0000 mL | Freq: Once | INTRAVENOUS | Status: AC
  Administered 2022-04-06: 333 mL via INTRAVENOUS

## 2022-04-06 MED ORDER — SENNOSIDES-DOCUSATE SODIUM 8.6-50 MG PO TABS
2.0000 | ORAL_TABLET | Freq: Every day | ORAL | Status: DC
  Administered 2022-04-07: 2 via ORAL
  Filled 2022-04-06 (×2): qty 2

## 2022-04-06 MED ORDER — IBUPROFEN 600 MG PO TABS
600.0000 mg | ORAL_TABLET | Freq: Four times a day (QID) | ORAL | Status: DC
Start: 2022-04-06 — End: 2022-04-07
  Administered 2022-04-06 – 2022-04-07 (×4): 600 mg via ORAL
  Filled 2022-04-06 (×4): qty 1

## 2022-04-06 MED ORDER — PRENATAL MULTIVITAMIN CH: 1.0000 | ORAL_TABLET | Freq: Every day | ORAL | Status: DC

## 2022-04-06 MED ORDER — PHENYLEPHRINE 80 MCG/ML (10ML) SYRINGE FOR IV PUSH (FOR BLOOD PRESSURE SUPPORT): 80.0000 ug | PREFILLED_SYRINGE | INTRAVENOUS | Status: DC | PRN

## 2022-04-06 MED ORDER — OXYTOCIN-SODIUM CHLORIDE 30-0.9 UT/500ML-% IV SOLN
1.0000 m[IU]/min | INTRAVENOUS | Status: DC
  Administered 2022-04-06: 2 m[IU]/min via INTRAVENOUS

## 2022-04-06 MED ORDER — PRENATAL PLUS 27-1 MG PO TABS
1.0000 | ORAL_TABLET | Freq: Every day | ORAL | Status: DC
Start: 2022-04-07 — End: 2022-04-07
  Administered 2022-04-07: 1 via ORAL
  Filled 2022-04-06: qty 1

## 2022-04-06 MED ORDER — ACETAMINOPHEN 325 MG PO TABS: 650.0000 mg | ORAL_TABLET | ORAL | Status: DC | PRN

## 2022-04-06 MED ORDER — ACETAMINOPHEN 325 MG PO TABS
650.0000 mg | ORAL_TABLET | ORAL | Status: DC | PRN
  Administered 2022-04-06 – 2022-04-07 (×4): 650 mg via ORAL
  Filled 2022-04-06 (×4): qty 2

## 2022-04-06 MED ORDER — TERBUTALINE SULFATE 1 MG/ML IJ SOLN: 0.2500 mg | Freq: Once | INTRAMUSCULAR | Status: DC | PRN

## 2022-04-06 MED ORDER — ONDANSETRON HCL 4 MG/2ML IJ SOLN: 4.0000 mg | Freq: Four times a day (QID) | INTRAMUSCULAR | Status: DC | PRN

## 2022-04-06 MED ORDER — SOD CITRATE-CITRIC ACID 500-334 MG/5ML PO SOLN: 30.0000 mL | ORAL | Status: DC | PRN

## 2022-04-06 MED ORDER — SIMETHICONE 80 MG PO CHEW: 80.0000 mg | CHEWABLE_TABLET | ORAL | Status: DC | PRN

## 2022-04-06 MED ORDER — FENTANYL-BUPIVACAINE-NACL 0.5-0.125-0.9 MG/250ML-% EP SOLN
EPIDURAL | Status: AC
  Filled 2022-04-06: qty 250

## 2022-04-06 MED ORDER — ONDANSETRON HCL 4 MG/2ML IJ SOLN
4.0000 mg | INTRAMUSCULAR | Status: DC | PRN
Start: 2022-04-06 — End: 2022-04-07

## 2022-04-06 MED ORDER — BENZOCAINE-MENTHOL 20-0.5 % EX AERO
1.0000 "application " | INHALATION_SPRAY | CUTANEOUS | Status: DC | PRN
  Filled 2022-04-06: qty 56

## 2022-04-06 MED ORDER — ONDANSETRON HCL 4 MG PO TABS: 4.0000 mg | ORAL_TABLET | ORAL | Status: DC | PRN

## 2022-04-06 MED ORDER — BUPIVACAINE HCL (PF) 0.25 % IJ SOLN
INTRAMUSCULAR | Status: DC | PRN
  Administered 2022-04-06 (×2): 4 mL via EPIDURAL

## 2022-04-06 MED ORDER — DM-GUAIFENESIN ER 30-600 MG PO TB12
1.0000 | ORAL_TABLET | Freq: Two times a day (BID) | ORAL | Status: DC
  Administered 2022-04-06 – 2022-04-07 (×2): 1 via ORAL
  Filled 2022-04-06 (×3): qty 1

## 2022-04-06 MED ORDER — ALBUTEROL SULFATE (2.5 MG/3ML) 0.083% IN NEBU: 3.0000 mL | INHALATION_SOLUTION | Freq: Four times a day (QID) | RESPIRATORY_TRACT | Status: DC | PRN

## 2022-04-06 MED ORDER — DIBUCAINE (PERIANAL) 1 % EX OINT: 1.0000 "application " | TOPICAL_OINTMENT | CUTANEOUS | Status: DC | PRN

## 2022-04-06 MED ORDER — LIDOCAINE HCL (PF) 1 % IJ SOLN
INTRAMUSCULAR | Status: DC | PRN
  Administered 2022-04-06: 3 mL

## 2022-04-06 MED ORDER — DIPHENHYDRAMINE HCL 50 MG/ML IJ SOLN: 12.5000 mg | INTRAMUSCULAR | Status: DC | PRN

## 2022-04-06 MED ORDER — LACTATED RINGERS IV SOLN
INTRAVENOUS | Status: DC
Start: 2022-04-06 — End: 2022-04-06
  Administered 2022-04-06: 800 mL via INTRAVENOUS

## 2022-04-06 MED ORDER — LIDOCAINE HCL (PF) 1 % IJ SOLN: 30.0000 mL | INTRAMUSCULAR | Status: DC | PRN

## 2022-04-06 MED ORDER — COCONUT OIL OIL: 1.0000 "application " | TOPICAL_OIL | Status: DC | PRN

## 2022-04-06 MED ORDER — LACTATED RINGERS IV SOLN: 500.0000 mL | INTRAVENOUS | Status: DC | PRN

## 2022-04-06 MED ORDER — FENTANYL CITRATE (PF) 100 MCG/2ML IJ SOLN
50.0000 ug | INTRAMUSCULAR | Status: DC | PRN
  Administered 2022-04-06: 50 ug via INTRAVENOUS
  Filled 2022-04-06: qty 2

## 2022-04-06 MED ORDER — OXYTOCIN-SODIUM CHLORIDE 30-0.9 UT/500ML-% IV SOLN
2.5000 [IU]/h | INTRAVENOUS | Status: DC
  Filled 2022-04-06: qty 500

## 2022-04-06 MED ORDER — LACTATED RINGERS IV SOLN: 500.0000 mL | Freq: Once | INTRAVENOUS | Status: DC

## 2022-04-06 MED ORDER — LIDOCAINE-EPINEPHRINE (PF) 1.5 %-1:200000 IJ SOLN
INTRAMUSCULAR | Status: DC | PRN
  Administered 2022-04-06: 3 mL via EPIDURAL

## 2022-04-06 MED ORDER — MEASLES, MUMPS & RUBELLA VAC IJ SOLR
0.5000 mL | Freq: Once | INTRAMUSCULAR | Status: AC
  Administered 2022-04-07: 0.5 mL via SUBCUTANEOUS
  Filled 2022-04-06 (×2): qty 0.5

## 2022-04-06 MED ORDER — WITCH HAZEL-GLYCERIN EX PADS: 1.0000 "application " | MEDICATED_PAD | CUTANEOUS | Status: DC | PRN

## 2022-04-06 MED ORDER — DIPHENHYDRAMINE HCL 25 MG PO CAPS: 25.0000 mg | ORAL_CAPSULE | Freq: Four times a day (QID) | ORAL | Status: DC | PRN

## 2022-04-06 MED ORDER — CALCIUM CARBONATE ANTACID 500 MG PO CHEW: 2.0000 | CHEWABLE_TABLET | ORAL | Status: DC | PRN

## 2022-04-06 MED ORDER — FENTANYL-BUPIVACAINE-NACL 0.5-0.125-0.9 MG/250ML-% EP SOLN
12.0000 mL/h | EPIDURAL | Status: DC | PRN
  Administered 2022-04-06: 12 mL/h via EPIDURAL

## 2022-04-06 MED ORDER — ZOLPIDEM TARTRATE 5 MG PO TABS: 5.0000 mg | ORAL_TABLET | Freq: Every evening | ORAL | Status: DC | PRN

## 2022-04-06 NOTE — OB Triage Note (Signed)
Pt presents c/o ctx that started around 3:30 am. Pt states ctx are about 4 mins apart. Pt rates ctx 6/10 on pain scale. Pt denies bleeding. Pt reports positive fetal movement. Pt reports having a lot of vaginal discharge. Initial BP elevated at 146/88. BP cycling q75mins. All other VSS. Pt denies HA, blurry vision, or epigastric pain. No clonus noted. +2 reflexes. Fetal monitoring applied. Will continue to monitor.  ?

## 2022-04-06 NOTE — Anesthesia Procedure Notes (Signed)
Epidural ?Patient location during procedure: OB ?Start time: 04/06/2022 9:38 AM ? ?Staffing ?Anesthesiologist: Corinda Gubler, MD ?Resident/CRNA: Stormy Fabian, CRNA ?Performed: resident/CRNA  ? ?Preanesthetic Checklist ?Completed: patient identified, IV checked, site marked, risks and benefits discussed, surgical consent, monitors and equipment checked, pre-op evaluation and timeout performed ? ?Epidural ?Patient position: sitting ?Prep: ChloraPrep ?Patient monitoring: heart rate, continuous pulse ox and blood pressure ?Approach: midline ?Location: L3-L4 ?Injection technique: LOR saline ? ?Needle:  ?Needle type: Tuohy  ?Needle gauge: 17 G ?Needle length: 9 cm and 9 ?Needle insertion depth: 7 cm ?Catheter type: closed end flexible ?Catheter size: 19 Gauge ?Catheter at skin depth: 12 cm ?Test dose: negative and 1.5% lidocaine with Epi 1:200 K ? ?Assessment ?Events: blood not aspirated, injection not painful, no injection resistance, no paresthesia and negative IV test ? ?Additional Notes ?3 attempt ?Pt. Evaluated and documentation done after procedure finished. ?Patient identified. Risks/Benefits/Options discussed with patient including but not limited to bleeding, infection, nerve damage, paralysis, failed block, incomplete pain control, headache, blood pressure changes, nausea, vomiting, reactions to medication both or allergic, itching and postpartum back pain. Confirmed with bedside nurse the patient's most recent platelet count. Confirmed with patient that they are not currently taking any anticoagulation, have any bleeding history or any family history of bleeding disorders. Patient expressed understanding and wished to proceed. All questions were answered. Sterile technique was used throughout the entire procedure. Please see nursing notes for vital signs. Test dose was given through epidural catheter and negative prior to continuing to dose epidural or start infusion. Warning signs of high block given to the  patient including shortness of breath, tingling/numbness in hands, complete motor block, or any concerning symptoms with instructions to call for help. Patient was given instructions on fall risk and not to get out of bed. All questions and concerns addressed with instructions to call with any issues or inadequate analgesia.   ? ?Patient tolerated the insertion well without immediate complications.Reason for block:procedure for pain ? ? ? ?

## 2022-04-06 NOTE — Anesthesia Preprocedure Evaluation (Addendum)
Anesthesia Evaluation  ?Patient identified by MRN, date of birth, ID band ?Patient awake ? ? ? ?Reviewed: ?Allergy & Precautions, H&P , NPO status , Patient's Chart, lab work & pertinent test results ? ?History of Anesthesia Complications ?Negative for: history of anesthetic complications ? ?Airway ?Mallampati: II ? ?TM Distance: >3 FB ?Neck ROM: full ? ? ? Dental ?no notable dental hx. ? ?  ?Pulmonary ?asthma ,  ?  ?Pulmonary exam normal ? ? ? ? ? ? ? Cardiovascular ?Exercise Tolerance: Good ? ?Rhythm:regular Rate:Normal ? ?Kawasaki Disease ?  ?Neuro/Psych ? Headaches,   ? GI/Hepatic ?Neg liver ROS, GERD  ,  ?Endo/Other  ? ? Renal/GU ?negative Renal ROS  ?negative genitourinary ?  ?Musculoskeletal ? ? Abdominal ?  ?Peds ? Hematology ?negative hematology ROS ?(+)   ?Anesthesia Other Findings ? ? Reproductive/Obstetrics ?(+) Pregnancy ? ?  ? ? ? ? ? ? ? ? ? ? ? ? ? ?  ?  ? ? ? ? ? ? ? ?Anesthesia Physical ?Anesthesia Plan ? ?ASA: 3 ? ?Anesthesia Plan: Epidural  ? ?Post-op Pain Management:   ? ?Induction:  ? ?PONV Risk Score and Plan:  ? ?Airway Management Planned:  ? ?Additional Equipment:  ? ?Intra-op Plan:  ? ?Post-operative Plan:  ? ?Informed Consent: I have reviewed the patients History and Physical, chart, labs and discussed the procedure including the risks, benefits and alternatives for the proposed anesthesia with the patient or authorized representative who has indicated his/her understanding and acceptance.  ? ? ? ? ? ?Plan Discussed with: Anesthesiologist ? ?Anesthesia Plan Comments:   ? ? ? ? ? ? ?Anesthesia Quick Evaluation ? ?

## 2022-04-06 NOTE — Lactation Note (Signed)
This note was copied from a baby's chart. ?Lactation Consultation Note ? ?Patient Name: Judith Chapman ?Today's Date: 04/06/2022 ?Reason for consult: L&D Initial assessment;Primapara;1st time breastfeeding;Term;RN request (Transition RN) ?Age:29 hours ? ?Maternal Data ?Has patient been taught Hand Expression?: Yes ?Does the patient have breastfeeding experience prior to this delivery?: No ? ?Feeding ?Mother's Current Feeding Choice: Breast Milk ? ?Baby <1hr old, transition had attempted a sustained latch-unsuccessful ? ?LATCH Score ?Latch: Repeated attempts needed to sustain latch, nipple held in mouth throughout feeding, stimulation needed to elicit sucking reflex. ? ?Audible Swallowing: A few with stimulation ? ?Type of Nipple: Everted at rest and after stimulation ? ?Comfort (Breast/Nipple): Soft / non-tender ? ?Hold (Positioning): Assistance needed to correctly position infant at breast and maintain latch. ? ?LATCH Score: 7 ? ?LC at bedside, baby already in cross-cradle hold. Mom has everted nipples that evert further with stimulation. Hand expression demonstrated and used to encourage latch. Baby fed on/off for 7 minutes; mom notes tugging sensation, no discomfort. ? ?Lactation Tools Discussed/Used ?  ? ?Interventions ?Interventions: Breast feeding basics reviewed;Assisted with latch;Skin to skin;Hand express;Breast compression;Education (First 24 hours, feeding w/ early cues, skin to skin, output expectations) ? ?Reviewed first 24 hour feeding expectations in newborns. Tips for successful latch and hand expression taught. Encouraged feeding with early cues, and information given for output expectations. ? ?Discharge ?  ? ?Consult Status ?Consult Status: Follow-up from L&D ? ?Encouraged to call w/ questions or for ongoing BF assistance. ? ?Danford Bad ?04/06/2022, 3:04 PM ? ? ? ?

## 2022-04-06 NOTE — Discharge Summary (Signed)
Obstetrical Discharge Summary  Patient Name: Judith Chapman DOB: Jun 12, 1991 MRN: [id number]  Date of Admission: 04/06/2022 Date of Delivery: 04/06/2022 Delivered by: Lucrezia Europe, CNM Date of Discharge: 04/07/2022  Primary OB: [facility]  CWC:[id number]'D last menstrual period was 07/04/2021 (approximate). 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 She rapidly progressed to 10/100/+2 then pushed for 3 hrs, delivering viable female infant over intact perineum and left sulcal tear. Apgars 8/9.  Date of Delivery: 04/06/2022 Delivered By: DLucrezia Europe CNM Delivery Type: spontaneous vaginal delivery Anesthesia: epidural Placenta: spontaneous Laceration: left sulcal Episiotomy: none Newborn Data: Live born female "Judith Chapman" Birth Weight:  8lb 13oz APGAR: 8, 9  Newborn Delivery   Birth date/time: 04/06/2022 13:58:00 Delivery type: Vaginal, Spontaneous      Postpartum Procedures: echocardiogram per Judith Chapman      View : No data to display.           Post partum course:  Patient had an uncomplicated postpartum course.  By time of discharge on PPD#1, her pain was controlled on oral pain medications; she had appropriate lochia and was ambulating, voiding without difficulty and tolerating regular diet.  She was deemed stable for discharge to home.    Discharge Physical Exam:  BP 122/90 (BP  Location: Right Arm)   Pulse 68   Temp 97.8 F (36.6 C) (Oral)   Resp 20   Ht '5\' 6"'  (1.676 m)   Wt 86.6 kg   LMP 07/04/2021 (Approximate)   SpO2 99%   Breastfeeding Unknown   BMI 30.83 kg/m   General: NAD CV: RRR Pulm: CTABL, nl effort ABD: s/nd/nt, fundus firm and below the umbilicus Lochia: moderate Perineum: well approximated/intact DVT Evaluation: LE non-ttp, no evidence of DVT on exam.  Hemoglobin  Date Value Ref Range Status  04/07/2022 9.7 (L) 12.0 - 15.0 g/dL Final   HCT  Date Value Ref Range Status  04/07/2022 28.9 (L) 36.0 - 46.0 % Final     Disposition: stable, discharge to home. Baby Feeding: breastmilk Baby Disposition: home with mom  Rh Immune globulin given: n/a, Rh pos Rubella vaccine given: MMR offered prior to discharge  Varicella vaccine given: immune Tdap vaccine given in AP or PP setting: given 01/21/2022 Flu vaccine given in AP or PP setting: given 10/29/2021  Contraception: Liletta IUD  Prenatal Labs:  Blood type/Rh O pos  Antibody screen neg  Rubella NON-Immune  Varicella Immune  RPR NR  HBsAg Neg  HIV NR  GC neg  Chlamydia neg  Genetic screening negative  1 hour GTT 125  3 hour GTT    GBS negative     Plan:  Judith Chapman was discharged to home in good condition. Follow-up appointment with delivering provider in 6 weeks.  Discharge Medications: Allergies as of 04/07/2022       Reactions   Doxycycline Anaphylaxis   Morphine And Related Other (See Comments)   Throat swelling   Augmentin [amoxicillin-pot Clavulanate] Nausea And Vomiting   Sulfa Antibiotics Rash  Medication List     STOP taking these medications    aspirin 81 MG chewable tablet   dextromethorphan-guaiFENesin 30-600 MG 12hr tablet Commonly known as: MUCINEX DM   Junel FE 1.5/30 1.5-30 MG-MCG tablet Generic drug: norethindrone-ethinyl estradiol-iron   ketorolac 10 MG tablet Commonly known as: TORADOL   Magnesium Gluconate 550 MG  Tabs   nitrofurantoin (macrocrystal-monohydrate) 100 MG capsule Commonly known as: MACROBID   nortriptyline 10 MG capsule Commonly known as: PAMELOR   pyridoxine 100 MG tablet Commonly known as: B-6   SUMAtriptan 100 MG tablet Commonly known as: IMITREX       TAKE these medications    acetaminophen 325 MG tablet Commonly known as: Tylenol Take 2 tablets (650 mg total) by mouth every 4 (four) hours as needed (for pain scale < 4).   albuterol 0.63 MG/3ML nebulizer solution Commonly known as: ACCUNEB Take 1 ampule by nebulization every 6 (six) hours as needed for wheezing.   doxylamine (Sleep) 25 MG tablet Commonly known as: UNISOM Take 25 mg by mouth at bedtime as needed.   ibuprofen 600 MG tablet Commonly known as: ADVIL Take 1 tablet (600 mg total) by mouth every 6 (six) hours as needed for mild pain or cramping. What changed:  medication strength how much to take reasons to take this   metoprolol succinate 25 MG 24 hr tablet Commonly known as: TOPROL-XL Take 25 mg by mouth daily.   multivitamin-prenatal 27-0.8 MG Tabs tablet Take 1 tablet by mouth daily at 12 noon.   vitamin B-12 100 MCG tablet Commonly known as: CYANOCOBALAMIN Take 100 mcg by mouth daily.         Follow-up Information     Gertie Fey, CNM Follow up in 2 week(s).   Specialty: Certified Nurse Midwife Why: mood check Contact information: [place] [address] [phone]         Gertie Fey, CNM Follow up in 6 week(s).   Specialty: Certified Nurse Midwife Why: 6wk postpartum, wants Liletta IUD Contact information: [place] [address] [phone]                 Signed:  Terance Ice 04/07/2022 8:46 AM  Drinda Butts, CNM Certified Nurse Midwife [place]

## 2022-04-06 NOTE — Progress Notes (Signed)
Labor Progress Note ? ?Judith Chapman is a 31 y.o. G1P0000 at [redacted]w[redacted]d by LMP admitted for active labor, rupture of membranes ? ?Subjective: Called by RN for delivery, patient is feeling constant rectal pressure ? ?Objective: ?BP (!) 146/88 (BP Location: Left Arm)   Pulse 80   Temp 97.6 ?F (36.4 ?C) (Oral)   Resp 20   Ht 5\' 6"  (1.676 m)   Wt 86.6 kg   LMP 07/04/2021 (Approximate)   SpO2 100%   BMI 30.83 kg/m?  ?Notable VS details: reviewed ? ?Fetal Assessment: ?FHT:  FHR: 120 bpm, variability: moderate,  accelerations:  Present,  decelerations:  Absent ?Category/reactivity:  Category I ?UC:   regular, every 2-4 minutes ?SVE:    ?Dilation: 10cm  ?Effacement: 100%  ?Station:  +2  ?Consistency: soft  ?Position: middle  ?Membrane status:SROM @ 0500 ?Amniotic color: clear ? ?Labs: ?Lab Results  ?Component Value Date  ? WBC 11.9 (H) 04/06/2022  ? HGB 12.3 04/06/2022  ? HCT 36.3 04/06/2022  ? MCV 86.6 04/06/2022  ? PLT 234 04/06/2022  ? ? ?Assessment / Plan: ?Spontaneous labor, progressing normally ? ?Labor: Progressing normally, will begin pushing ?Preeclampsia:   BP WNL, no s/s pre-e ?Fetal Wellbeing:  Category I ?Pain Control:  Epidural ?I/D:  n/a ?Anticipated MOD:  NSVD ? ?Gertie Fey, CNM ?04/06/2022, 2:19 PM ? ? ? ? ? ? ? ? ?

## 2022-04-06 NOTE — H&P (Signed)
OB History & Physical  ? ?History of Present Illness:  ?Chief Complaint:  ? ?HPI:  ?Judith Chapman is a 31 y.o. G1P0000 female at [redacted]w[redacted]d dated by LMP.  She presents to L&D for leaking fluid since 0500 and contractions that started at 0330. Contractions are currently every 6 minutes. ? 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 ? ?Maternal Medical History:  ? ?Past Medical History:  ?Diagnosis Date  ? Asthma   ? Cystitis, interstitial   ? Frequent headaches   ? Pelvic pain in female   ? ? ?Past Surgical History:  ?Procedure Laterality Date  ? CYSTO WITH HYDRODISTENSION N/A 08/27/2015  ? Procedure: CYSTOSCOPY HYDRODISTENSION AND INSTTILLATION OF MARCAINE AND PRYDIUM ;  Surgeon: Alfredo Martinez, MD;  Location: [facility];  Service: Urology;  Laterality: N/A;  ? CYSTO/  HYDRODISTENTION/  BLADDER BX  02-28-2012  ? TONSILLECTOMY    ? WISDOM TOOTH EXTRACTION    ? ? ?Allergies  ?Allergen Reactions  ? Doxycycline Anaphylaxis  ? Morphine And Related Other (See Comments)  ?  Throat swelling  ? Augmentin [Amoxicillin-Pot Clavulanate] Nausea And Vomiting  ? Sulfa Antibiotics Rash  ? ? ?Prior to Admission medications   ?Medication Sig Start Date End Date Taking? Authorizing Provider  ?albuterol (ACCUNEB) 0.63 MG/3ML nebulizer solution Take 1 ampule by nebulization every 6 (six) hours as needed for wheezing.   Yes [provider]  ?aspirin 81 MG chewable tablet Chew by mouth daily.   Yes [provider]  ?dextromethorphan-guaiFENesin (MUCINEX DM) 30-600 MG 12hr tablet Take 1 tablet by mouth 2 (two) times daily.   Yes [provider]  ?doxylamine, Sleep, (UNISOM) 25 MG tablet Take 25 mg by mouth at bedtime as needed.   Yes [provider]  ?metoprolol succinate (TOPROL-XL) 25 MG 24 hr tablet Take 25 mg by mouth  daily.   Yes [provider]  ?nitrofurantoin, macrocrystal-monohydrate, (MACROBID) 100 MG capsule Take 100 mg by mouth 2 (two) times daily.   Yes [provider]  ?Prenatal Vit-Fe Fumarate-FA (MULTIVITAMIN-PRENATAL) 27-0.8 MG TABS tablet Take 1 tablet by mouth daily at 12 noon.   Yes [provider]  ?pyridoxine (B-6) 100 MG tablet Take 100 mg by mouth daily.   Yes [provider]  ?vitamin B-12 (CYANOCOBALAMIN) 100 MCG tablet Take 100 mcg by mouth daily.   Yes [provider]  ?ibuprofen (ADVIL,MOTRIN) 200 MG tablet Take 800 mg by mouth every 6 (six) hours as needed for moderate pain. ?Patient not taking: Reported on 04/06/2022    [provider]  ?JUNEL FE 1.5/30 1.5-30 MG-MCG tablet TAKE 1 TABLET BY MOUTH EVERY DAY ?Patient not taking: Reported on 04/06/2022 04/27/20   Doreene Burke, CNM  ?ketorolac (TORADOL) 10 MG tablet Take 1 tablet (10 mg total) by mouth every 6 (six) hours as needed. ?Patient not taking: Reported on 04/06/2022 06/10/19   Doreene Burke, CNM  ?Magnesium Gluconate 550 MG TABS Take by mouth. ?Patient not taking: Reported on 04/06/2022    [provider]  ?nortriptyline (PAMELOR) 10 MG capsule Take 10 mg by mouth at bedtime. ?Patient not taking: Reported on 04/06/2022    [provider]  ?SUMAtriptan (IMITREX) 100 MG tablet TAKE 1 TABLET BY MOUTH AS NEEDED FOR HEADACHE. MAY REPEAT ONCE IN 2 HOURS IF NEEDED ?Patient not taking: Reported on  04/06/2022 02/04/19   [provider]  ? ? ? ?Prenatal care site: [facility]  ? ?Social History: She  reports that she has never smoked. She has quit using smokeless tobacco. She reports current alcohol use. She reports that she does not use drugs. ? ?Family History: family history is not on file.  ? ?Review of Systems: A full review of systems was performed and negative except as noted in the HPI.   ? ? ?Physical Exam:  ?Vital Signs: BP (!) 146/88 (BP Location: Left Arm)    Pulse 80   Temp 98.4 ?F (36.9 ?C) (Oral)   Resp 20   Ht 5\' 6"  (1.676 m)   Wt 86.6 kg   LMP 07/04/2021 (Approximate)   SpO2 100%   BMI 30.83 kg/m?  ?General: no acute distress.  ?HEENT: normocephalic, atraumatic ?Heart: regular rate & rhythm.  No murmurs/rubs/gallops ?Lungs: clear to auscultation bilaterally, normal respiratory effort ?Abdomen: soft, gravid, non-tender;  EFW: 7.5lb ?Pelvic:  ? External: Normal external female genitalia ? Cervix: Dilation: 3 / Effacement (%): 100 / Station: -1  ?  ?Extremities: non-tender, symmetric, mild edema bilaterally.  DTRs: +2  ?Neurologic: Alert & oriented x 3.   ? ?No results found for this or any previous visit (from the past 24 hour(s)). ? ?Pertinent Results:  ?Prenatal Labs: ?Blood type/Rh O pos  ?Antibody screen neg  ?Rubella NON-Immune  ?Varicella Immune  ?RPR NR  ?HBsAg Neg  ?HIV NR  ?GC neg  ?Chlamydia neg  ?Genetic screening negative  ?1 hour GTT 125  ?3 hour GTT   ?GBS negative  ? ?FHT: 135bpm, moderate variability, accels present ?TOCO: q76min ?SVE:  Dilation: 3 / Effacement (%): 100 / Station: -1  ?  ?Cephalic by leopolds ? ?No results found. ? ?Assessment:  ?Judith Chapman is a 31 y.o. G1P0000 female at [redacted]w[redacted]d with SROM and uterine contractions.  ? ?Plan:  ?1. Admit to Labor & Delivery; consents reviewed and obtained ? ?2. Fetal Well being  ?- Fetal Tracing: Cat I ?- Group B Streptococcus ppx indicated: negative ?- Presentation: vertex confirmed by SVE  ? ?3. Routine OB: ?- Prenatal labs reviewed, as above ?- Rh positive ?- CBC, T&S, RPR on admit ?- Clear fluids, IVF ? ?4. Monitoring of Labor ?-  Contractions q5-64min, external toco in place ?-  Continuous telemetry monitoring per [facility] cardiology for hx of Kawasaki disease ?- [facility] notified pt is present in labor ?-  Plan for continuous fetal monitoring  ?-  Maternal pain control as desired; requesting regional anesthesia ?- Anticipate vaginal delivery ?- [facility] aware and agreeable with POC ? ?5.  Post Partum Planning: ?- Infant feeding: breast ?- Contraception: Liletta IUD ? ?Jean Rosenthal, CNM ?04/06/22 ?9:07 AM ? ? ? ? ?

## 2022-04-07 ENCOUNTER — Inpatient Hospital Stay
Admit: 2022-04-07 | Discharge: 2022-04-07 | Disposition: A | Discharge status: Home / Self Care | Payer: BC Managed Care – PPO | Attending: Certified Nurse Midwife | Admitting: Certified Nurse Midwife

## 2022-04-07 LAB — ECHOCARDIOGRAM COMPLETE
AR max vel: 1.6 cm2
AV Area VTI: 1.57 cm2
AV Area mean vel: 1.49 cm2
AV Mean grad: 3 mmHg
AV Peak grad: 5.4 mmHg
Ao pk vel: 1.16 m/s
Area-P 1/2: 4.86 cm2
Height: 66 in
MV VTI: 1.75 cm2
S' Lateral: 3.9 cm
Weight: 3056 oz

## 2022-04-07 LAB — CBC
HCT: 28.9 % — ABNORMAL LOW (ref 36.0–46.0)
Hemoglobin: 9.7 g/dL — ABNORMAL LOW (ref 12.0–15.0)
MCH: 29.7 pg (ref 26.0–34.0)
MCHC: 33.6 g/dL (ref 30.0–36.0)
MCV: 88.4 fL (ref 80.0–100.0)
Platelets: 183 10*3/uL (ref 150–400)
RBC: 3.27 MIL/uL — ABNORMAL LOW (ref 3.87–5.11)
RDW: 14.9 % (ref 11.5–15.5)
WBC: 12 10*3/uL — ABNORMAL HIGH (ref 4.0–10.5)
nRBC: 0.2 % (ref 0.0–0.2)

## 2022-04-07 MED ORDER — ACETAMINOPHEN 325 MG PO TABS: 650.0000 mg | ORAL_TABLET | ORAL | Status: AC | PRN

## 2022-04-07 MED ORDER — IBUPROFEN 600 MG PO TABS: 600.0000 mg | ORAL_TABLET | Freq: Four times a day (QID) | ORAL | 1 refills | Status: AC | PRN

## 2022-04-07 NOTE — Progress Notes (Signed)
*  PRELIMINARY RESULTS* Echocardiogram 2D Echocardiogram has been performed.  Judith Chapman 04/07/2022, 8:48 AM

## 2022-04-07 NOTE — Discharge Instructions (Signed)
Vaginal Delivery, Care After Refer to this sheet in the next few weeks. These discharge instructions provide you with information on caring for yourself after delivery. Your caregiver may also give you specific instructions. Your treatment has been planned according to the most current medical practices available, but problems sometimes occur. Call your caregiver if you have any problems or questions after you go home. HOME CARE INSTRUCTIONS Take over-the-counter or prescription medicines only as directed by your caregiver or pharmacist. Do not drink alcohol, especially if you are breastfeeding or taking medicine to relieve pain. Do not smoke tobacco. Continue to use good perineal care. Good perineal care includes: Wiping your perineum from back to front Keeping your perineum clean. You can do sitz baths twice a day, to help keep this area clean Do not use tampons, douche or have sex until your caregiver says it is okay. Shower only and avoid sitting in submerged water, aside from sitz baths Wear a well-fitting bra that provides breast support. Eat healthy foods. Drink enough fluids to keep your urine clear or pale yellow. Eat high-fiber foods such as whole grain cereals and breads, brown rice, beans, and fresh fruits and vegetables every day. These foods may help prevent or relieve constipation. Avoid constipation with high fiber foods or medications, such as miralax or metamucil Follow your caregiver's recommendations regarding resumption of activities such as climbing stairs, driving, lifting, exercising, or traveling. Talk to your caregiver about resuming sexual activities. Resumption of sexual activities is dependent upon your risk of infection, your rate of healing, and your comfort and desire to resume sexual activity. Try to have someone help you with your household activities and your newborn for at least a few days after you leave the hospital. Rest as much as possible. Try to rest or  take a nap when your newborn is sleeping. Increase your activities gradually. Keep all of your scheduled postpartum appointments. It is very important to keep your scheduled follow-up appointments. At these appointments, your caregiver will be checking to make sure that you are healing physically and emotionally. SEEK MEDICAL CARE IF:  You are passing large clots from your vagina. Save any clots to show your caregiver. You have a foul smelling discharge from your vagina. You have trouble urinating. You are urinating frequently. You have pain when you urinate. You have a change in your bowel movements. You have increasing redness, pain, or swelling near your vaginal incision (episiotomy) or vaginal tear. You have pus draining from your episiotomy or vaginal tear. Your episiotomy or vaginal tear is separating. You have painful, hard, or reddened breasts. You have a severe headache. You have blurred vision or see spots. You feel sad or depressed. You have thoughts of hurting yourself or your newborn. You have questions about your care, the care of your newborn, or medicines. You are dizzy or light-headed. You have a rash. You have nausea or vomiting. You were breastfeeding and have not had a menstrual period within 12 weeks after you stopped breastfeeding. You are not breastfeeding and have not had a menstrual period by the 12th week after delivery. You have a fever. SEEK IMMEDIATE MEDICAL CARE IF:  You have persistent pain. You have chest pain. You have shortness of breath. You faint. You have leg pain. You have stomach pain. Your vaginal bleeding saturates two or more sanitary pads in 1 hour. MAKE SURE YOU:  Understand these instructions. Will watch your condition. Will get help right away if you are not doing well or   get worse. Document Released: 11/04/2000 Document Revised: 03/24/2014 Document Reviewed: 07/04/2012 ExitCare Patient Information 2015 [employer]. This  information is not intended to replace advice given to you by your health care provider. Make sure you discuss any questions you have with your health care provider.  Sitz Bath A sitz bath is a warm water bath taken in the sitting position. The water covers only the hips and butt (buttocks). We recommend using one that fits in the toilet, to help with ease of use and cleanliness. It may be used for either healing or cleaning purposes. Sitz baths are also used to relieve pain, itching, or muscle tightening (spasms). The water may contain medicine. Moist heat will help you heal and relax.  HOME CARE  Take 3 to 4 sitz baths a day. Fill the bathtub half-full with warm water. Sit in the water and open the drain a little. Turn on the warm water to keep the tub half-full. Keep the water running constantly. Soak in the water for 15 to 20 minutes. After the sitz bath, pat the affected area dry. GET HELP RIGHT AWAY IF: You get worse instead of better. Stop the sitz baths if you get worse. MAKE SURE YOU: Understand these instructions. Will watch your condition. Will get help right away if you are not doing well or get worse. Document Released: 12/15/2004 Document Revised: 08/01/2012 Document Reviewed: 03/07/2011 ExitCare Patient Information 2015 [employer]. This information is not intended to replace advice given to you by your health care provider. Make sure you discuss any questions you have with your health care provider.   

## 2022-04-07 NOTE — Anesthesia Postprocedure Evaluation (Signed)
Anesthesia Post Note  Patient: Judith Chapman  Procedure(s) Performed: AN AD HOC LABOR EPIDURAL  Patient location during evaluation: Mother Baby Anesthesia Type: Epidural Level of consciousness: awake and alert Pain management: pain level controlled Vital Signs Assessment: post-procedure vital signs reviewed and stable Respiratory status: spontaneous breathing, nonlabored ventilation and respiratory function stable Cardiovascular status: stable Postop Assessment: no headache, no backache and epidural receding Anesthetic complications: no   No notable events documented.   Last Vitals:  Vitals:   04/06/22 2344 04/07/22 0257  BP: 130/85 120/74  Pulse: 71 72  Resp: 18 18  Temp: 36.7 C 37 C  SpO2: 96% 96%    Last Pain:  Vitals:   04/07/22 0350  TempSrc:   PainSc: 2                  Kamaree Wheatley Lorenza Chick

## 2022-04-07 NOTE — Progress Notes (Signed)
Postpartum Day  1  Subjective: no complaints, up ad lib, voiding, and tolerating PO  Doing well, no concerns. Ambulating without difficulty, pain managed with PO meds, tolerating regular diet, and voiding without difficulty.   No fever/chills, chest pain, shortness of breath, nausea/vomiting, or leg pain. No nipple or breast pain. No headache, visual changes, or RUQ/epigastric pain.  Objective: BP 122/90 (BP Location: Right Arm)   Pulse 68   Temp 97.8 F (36.6 C) (Oral)   Resp 20   Ht '5\' 6"'  (1.676 m)   Wt 86.6 kg   LMP 07/04/2021 (Approximate)   SpO2 99%   Breastfeeding Unknown   BMI 30.83 kg/m    Physical Exam:  General: alert, cooperative, and no distress Breasts: soft/nontender CV: RRR Pulm: nl effort, CTABL Abdomen: soft, non-tender, active bowel sounds Uterine Fundus: firm Perineum: minimal edema, repair well approximated Lochia: appropriate DVT Evaluation: No evidence of DVT seen on physical exam.  Recent Labs    04/06/22 0905 04/07/22 0542  HGB 12.3 9.7*  HCT 36.3 28.9*  WBC 11.9* 12.0*  PLT 234 183    Assessment/Plan: 31 y.o. G1P1001 postpartum day # 1  -Continue routine postpartum care -Lactation consult PRN for breastfeeding  -Acute blood loss anemia - hemodynamically stable and asymptomatic; start PO ferrous sulfate BID with stool softeners  -Immunization status:   needs MMR prior to discharge  -Cardiology recommend postpartum echo inpatient - at bedside for exam.   Disposition: Continue inpatient postpartum care, Desires discharge home today   LOS: 1 day   Minda Meo, CNM 04/07/2022, 8:42 AM   ----- Drinda Butts  Certified Nurse Midwife [facility]

## 2022-04-07 NOTE — Progress Notes (Signed)
Pt discharged with infant.  Discharge instructions, prescriptions and follow up appointment given to and reviewed with pt. Pt verbalized understanding. Escorted out by auxillary. 

## 2022-05-30 ENCOUNTER — Ambulatory Visit: Payer: BC Managed Care – PPO | Attending: Obstetrics

## 2022-05-30 DIAGNOSIS — M6289 Other specified disorders of muscle: Secondary | ICD-10-CM | POA: Insufficient documentation

## 2022-05-30 DIAGNOSIS — R278 Other lack of coordination: Secondary | ICD-10-CM | POA: Insufficient documentation

## 2022-05-30 DIAGNOSIS — R1030 Lower abdominal pain, unspecified: Secondary | ICD-10-CM | POA: Diagnosis present

## 2022-05-30 DIAGNOSIS — M6281 Muscle weakness (generalized): Secondary | ICD-10-CM | POA: Diagnosis present

## 2022-05-30 NOTE — Therapy (Signed)
OUTPATIENT PHYSICAL THERAPY FEMALE PELVIC EVALUATION   Patient Name: Judith Chapman MRN: [id number] DOB:25-Jan-1991, 31 y.o., female Today's Date: 05/30/2022   PT End of Session - 05/30/22 1318     Visit Number 1    Number of Visits 12    Date for PT Re-Evaluation 08/22/22    Authorization Type IE: 05/30/22    PT Start Time 1316    PT Stop Time 1356    PT Time Calculation (min) 40 min    Activity Tolerance Patient tolerated treatment well             Past Medical History:  Diagnosis Date   Asthma    Cystitis, interstitial    Frequent headaches    Pelvic pain in female    Past Surgical History:  Procedure Laterality Date   CYSTO WITH HYDRODISTENSION N/A 08/27/2015   Procedure: CYSTOSCOPY HYDRODISTENSION AND INSTTILLATION OF MARCAINE AND PRYDIUM ;  Surgeon: Alfredo Martinez, MD;  Location: [facility] [place];  Service: Urology;  Laterality: N/A;   CYSTO/  HYDRODISTENTION/  BLADDER BX  02-28-2012   TONSILLECTOMY     WISDOM TOOTH EXTRACTION     Patient Active Problem List   Diagnosis Date Noted   Amniotic fluid leaking 04/06/2022   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 Unexplained weight loss? Saddle anesthesia? Unexplained changes in bowel or bladder habits?   SUBJECTIVE: Patient confirms identification and approves PT to assess pelvic floor and treatment Yes                                                                                                                                                                                            PRECAUTIONS: None  WEIGHT BEARING RESTRICTIONS: No  FALLS:  Has patient fallen in last 6 months? No  OCCUPATION/SOCIAL ACTIVITIES: Photographer  PLOF: Independent   CHIEF CONCERN: After birth in May 2023, Pt had to wear briefs because she had severe episodes of leakage and she could not tell when she was going to leak. Pt is currently still wearing pads due to the urinary leakage but  it is not like it was right after giving birth. Pt tried jogging the other day and she had to stop because she began leaking. Pt has a hx of interstitial cystitis and ovarian cysts that rupture causing a lot of pain. Pt feels when they rupture and feels it particularly on the R sided lower abdominals with occasional radiation to the vagina. The feeling is described as localized to the R and tight but becomes a "sharp, stabbing" pain. Pt reports not being able to move much when these episodes happen and finds the fetal position and a heating pad to bring some relief. Pt has been concerned with appendicitis but has been ruled out by providers. Pt has taken [facility] since she was 16 and when she stopped taking them in 2021 to get pregnant, she did not notice an increase or decrease in cysts. Pt currently has an IUD placement. Pt's last episode of pain was August of 2022 (before being pregnant). Pt reports 8/10 on NPRS scale and has been to the hospital many times due to the pain in the R abdomen. Pt also has a hx frequent UTI's but has been able to tell the difference between a UTI vs. an IC flare-up. Pt has urinary leakage with squatting, jogging, and laughing. In addition, Pt has had a hx of constipation.    PAIN:  Are you having pain? No Relieving factors: Heating pad when she does have R abdominal pain (has not occurred since before her pregnancy)    LIVING ENVIRONMENT: Lives with: lives  with their family Lives in: House/apartment   PATIENT GOALS: Being able to manage pain when it arises from IC, not having leakage or having to wear pads     UROLOGICAL HISTORY Fluid intake: 4-5 16oz water, 1-2 diet sodas, hot tea, occasional glass of wine  Pain with urination: No Fully empty bladder: No Stream: steady Urgency: Occasionally  Frequency: 6-10x Nocturia: occasionally 1x Leakage: Urge to void, Walking to the bathroom, Laughing, Exercise, Lifting, and Bending forward Pads: Yes:   Type: Large  Amount: 2-3x/day, not every time the pad is soiled Bladder control (0-10): 5/10  GASTROINTESTINAL HISTORY  Pain with bowel movement: Yes Type of bowel movement:Strain Yes, Bristol Stool Chart: Type 2   Frequency: 2-3 days  Fully empty rectum: No Leakage: No   SEXUAL HISTORY/FUNCTION  Pain with intercourse: No  Able to achieve orgasm?: Yes   OBSTETRICAL HISTORY Vaginal deliveries: G1P1 Tearing: Yes: two stitches Currently pregnant: No  GYNECOLOGICAL HISTORY Hysterectomy: no Pelvic Organ Prolapse: None Heaviness/pressure: yes, only when sitting on toilet    OBJECTIVE:    COGNITION: Overall cognitive status: Within functional limits for tasks assessed     POSTURE:  Grossly forward trunk flexion, crossed legs B and B plantarflexion  Lumbar lordosis:  Deferred 2/2 time constraints  Thoracic kyphosis: Iliac crest height:  Lumbar lateral shift:  Pelvic obliquity:  Leg length discrepancy:   GAIT: Deferred 2/2 time constraints  Distance walked:  Comments:   Trendelenburg:   SENSATION: Deferred 2/2 time constraints  Light touch: , L2-S2 dermatomes  Proprioception:    RANGE OF MOTION: Deferred 2/2 time constraints   (Norm range in degrees)  LEFT RIGHT   Lumbar forward flexion (65):      Lumbar extension (30):     Lumbar lateral flexion (25):     Thoracic and Lumbar rotation (30 degrees):       Hip Flexion (0-125):      Hip IR (0-45):  Hip ER  (0-45):     Hip Adduction:      Hip Abduction (0-40):     Hip extension (0-15):     (*= pain, Blank rows = not tested)   STRENGTH: MMT  Deferred 2/2 time constraints   RLE LLE   Hip Flexion    Hip Extension    Hip Abduction     Hip Adduction     Hip ER     Hip IR     Knee Extension    Knee Flexion    Dorsiflexion     Plantarflexion (seated)    (*= pain, Blank rows = not tested)   SPECIAL TESTS: Deferred 2/2 time constraints  Centralization and Peripheralization (SN 92, -LR 0.12):  Slump (SN 83, -LR 0.32): R:  L:  SLR (SN 92, -LR 0.29): R:  L:   Lumbar quadrant (SN 70): R:  L:  FABER (SN 81): R:  L:  FADIR (SN 94): R:  L:  Hip scour (SN 50): R:  L:  Thigh Thrust (SN 88, -LR 0.18) : R:  L:  Distraction [id number]): R:  L:  Compression (SN/SP 69): R:  L:  Stork/March (SP 93): R:  L:    PHYSICAL PERFORMANCE MEASURES: Deferred 2/2 time constraints   STS:  RLE SLS:  LLE SLS:  6 MWT:  10MWT:  5TSTS:   PALPATION: Deferred 2/2 time constraints  Abdominal:  Diastasis:   Scar mobility: present/mobile perpendicular, parallel Rib flare: present/absent  EXTERNAL PELVIC EXAM: Patient educated on the purpose of the pelvic exam and articulated understanding; patient consented to the exam verbally. Deferred 2/2 time constraints  Palpation: Breath coordination: present/absent/inconsistent Voluntary Contraction: present/absent Relaxation: full/delayed/non-relaxing Perineal movement with sustained IAP increase ("bear down"): descent/no change/elevation/excessive descent Perineal movement with rapid IAP increase ("cough"): elevation/no change/descent Pubic symphysis: (0= no contraction, 1= flicker, 2= weak squeeze, 3= fair squeeze with lift, 4= good squeeze and lift against resistance, 5= strong squeeze against strong resistance)   INTERNAL PELVIC EXAM: Patient educated on the purpose of the pelvic exam and articulated understanding; patient consented to the exam verbally. Deferred  2/2 to time constraints Introitus Appears:  Skin integrity:  Scar mobility: Strength (PERF):  Symmetry: Palpation: Prolapse: (0= no contraction, 1= flicker, 2= weak squeeze, 3= fair squeeze with lift, 4= good squeeze and lift against resistance, 5= strong squeeze against strong resistance)     Patient Education:  Patient educated on what to expect during course of physical therapy, POC, and provided with HEP including: bladder diary, bladder irritant and toileting posture handout. Patient verbalized understanding and returned demonstration. Patient will benefit from further education in order to maximize compliance and understanding for long-term therapeutic gains.    Patient Surveys:  FOTO Urinary Problem - 28  FOTO Bowel Constipation - 47     ASSESSMENT:  Clinical Impression: Patient is a 31 y.o. who was seen today for physical therapy evaluation and treatment for a chief concern of urinary leakage. Today's evaluation suggest deficits in IAP management, PFM coordination, PFM strength, PFM endurance, posture, and pain as evidenced by urinary leakage with squatting/physical activity/bending/lifting/laughing, use of pads 2-3x/day due to urinary leakage, hx of R lower abdominal pain from ovarian cysts (8/10 NPRS scale), feeling of incomplete emptying of bowel/bladder, B plantarflexion toileting posture (increase PFM tension), straining to have a BM, recurrent UTIs, hx of constipation (Bristol Stool Chart: Type 2), and increased B plantarflexion and forward flexed trunk in static sitting. Patient's responses on FOTO Urinary Problem (49)  and Bowel Constipation (47) indicates moderate limitation/disability/distress. Patient's progress may be limited due to time since onset and co-morbidities; however, patient's motivation is advantageous. Pt with basic understanding of PFM function in bowel/bladder, posture, sexual activity, and the deep core. Patient will benefit from skilled therapeutic  intervention to address deficits in IAP management, PFM coordination, PFM strength, PFM endurance, posture, and pain in order to increase PLOF and improve overall QOL.    Objective Impairments: decreased activity tolerance, decreased coordination, decreased endurance, decreased strength, increased fascial restrictions, improper body mechanics, postural dysfunction, and pain.   Activity Limitations: lifting, bending, squatting, continence, toileting, locomotion level, and caring for others  Personal Factors: Behavior pattern, Past/current experiences, Time since onset of injury/illness/exacerbation, and 3+ comorbidities: interstitial cystitis, chronic Has, and Kawasaki disease (heart)  are also affecting patient's functional outcome.   Rehab Potential: Good  Clinical Decision Making: Evolving/moderate complexity  Evaluation Complexity: Moderate   GOALS: Goals reviewed with patient? Yes  SHORT TERM GOALS: Target date: 07/11/2022  Patient will demonstrate independence with HEP in order to maximize therapeutic gains and improve carryover from physical therapy sessions to ADLs in the home and community. Baseline: toileting posture and bladder irritant handout Goal status: INITIAL   LONG TERM GOALS: Target date: 08/22/2022   Patient will report less than 5 incidents of stress urinary incontinence over the course of 3 weeks while coughing/sneezing/laughing/prolonged activity in order to demonstrate improved PFM coordination, strength, and function for improved overall QOL. Baseline: every time she laughs/squats/physical activity/bending Goal status: INITIAL  2.  Patient will report decreased reliance on protective undergarments as indicated by </= a 24 hour period to demonstrate improved bladder control and allow for increased participation in activities outside of the home. Baseline: 2-3x/day large pads  Goal status: INITIAL  3.  Patient will report confidence in ability to control  bladder > 8/10 in order to demonstrate improved function and ability to participate more fully in activities at home and in the community. Baseline: 5/10 Goal status: INITIAL  4.  Patient will demonstrate circumferential and sequential contraction of >4/5 MMT, > 5 sec hold x5 and 5 consecutive quick flicks with </= 10 min rest between testing bouts, and relaxation of the PFM coordinated with breath for improved management of intra-abdominal pressure and normal bowel and bladder function without the presence of pain nor incontinence in order to improve participation at home and in the community. Baseline: will assess next visit Goal status: INITIAL  5.  Patient will report being able to return to activities including, but not limited to: jogging, running, squatting, physical activity without limitation or disruption to indicate complete resolution of the chief concern and return to prior level of participation at home and in the community. Baseline: cannot participate in above activities due to leakage Goal status: INITIAL  6.  Patient will be able to demonstrate and verbalize pain modulation/mindfulness techniques in order to reduce worst pain by 2 points given on the NPRS scale.  Baseline: 8/10 R abdominal pain with ovarian cysts or IC flare-ups Goal status: INITIAL   7. Patient will score >/= 61 and 57 on FOTO Urinary Problem and Bowel Constipation in order to demonstrate improved IAP management, PFM strength, PFM coordination, and overall improved QOL.   Baseline: 49 and 47  Goal status: INITIAL   PLAN: PT Frequency: 1x/week  PT Duration: 12 weeks  Planned Interventions: Therapeutic exercises, Therapeutic activity, Neuromuscular re-education, Balance training, Gait training, Patient/Family education, Joint mobilization, Spinal mobilization, Cryotherapy, Moist heat, scar mobilization,  Taping, and Manual therapy  Plan For Next Session: physical assessment, go over bladder diary     Evanne Matsunaga, PT, DPT  05/30/2022, 2:03 PM

## 2022-06-06 ENCOUNTER — Ambulatory Visit: Payer: BC Managed Care – PPO

## 2022-06-06 DIAGNOSIS — R278 Other lack of coordination: Secondary | ICD-10-CM

## 2022-06-06 DIAGNOSIS — M6289 Other specified disorders of muscle: Secondary | ICD-10-CM

## 2022-06-06 DIAGNOSIS — R1030 Lower abdominal pain, unspecified: Secondary | ICD-10-CM

## 2022-06-06 DIAGNOSIS — M6281 Muscle weakness (generalized): Secondary | ICD-10-CM

## 2022-06-06 NOTE — Therapy (Signed)
OUTPATIENT PHYSICAL THERAPY FEMALE PELVIC TREATMENT   Patient Name: Judith Chapman MRN: [id number] DOB:Oct 12, 1991, 31 y.o., female Today's Date: 06/06/2022   PT End of Session - 06/06/22 1317     Visit Number 2    Number of Visits 12    Date for PT Re-Evaluation 08/22/22    PT Start Time 1316    PT Stop Time 1356    PT Time Calculation (min) 40 min    Activity Tolerance Patient tolerated treatment well             Past Medical History:  Diagnosis Date   Asthma    Cystitis, interstitial    Frequent headaches    Pelvic pain in female    Past Surgical History:  Procedure Laterality Date   CYSTO WITH HYDRODISTENSION N/A 08/27/2015   Procedure: CYSTOSCOPY HYDRODISTENSION AND INSTTILLATION OF MARCAINE AND PRYDIUM ;  Surgeon: Alfredo Martinez, MD;  Location: [facility];  Service: Urology;  Laterality: N/A;   CYSTO/  HYDRODISTENTION/  BLADDER BX  02-28-2012   TONSILLECTOMY     WISDOM TOOTH EXTRACTION     Patient Active Problem List   Diagnosis Date Noted   Amniotic fluid leaking 04/06/2022   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 No  OCCUPATION/SOCIAL ACTIVITIES: Photographer  PLOF: Independent   CHIEF CONCERN: After birth in May 2023, Pt had to wear briefs because she had severe episodes of leakage and she could not tell when she was going to leak. Pt  is currently still wearing pads due to the urinary leakage but it is not like it was right after giving birth. Pt tried jogging the other day and she had to stop because she began leaking. Pt has a hx of interstitial cystitis and ovarian cysts that rupture causing a lot of pain. Pt feels when they rupture and feels it particularly on the R sided lower abdominals with occasional radiation to the vagina. The feeling is described as localized to the R and tight but becomes a "sharp, stabbing" pain. Pt reports not being able to move much when these episodes happen and finds the fetal position and a heating pad to bring some relief. Pt has been concerned with appendicitis but has been ruled out by providers. Pt has taken [facility] since she was 16 and when she stopped taking them in 2021 to get pregnant, she did not notice an increase or decrease in cysts. Pt currently has an IUD placement. Pt's last episode of pain was August of 2022 (before being pregnant). Pt reports 8/10 on NPRS scale and has been to the hospital many times due to the pain in the R abdomen. Pt also has a hx frequent UTI's but has been able to tell the difference between a UTI vs. an IC flare-up. Pt has urinary leakage with squatting, jogging, and laughing. In addition, Pt has had a hx of constipation.    LIVING  ENVIRONMENT: Lives with: lives with their family Lives in: House/apartment   PATIENT GOALS: Being able to manage pain when it arises from IC, not having leakage or having to wear pads     UROLOGICAL HISTORY Fluid intake: 4-5 16oz water, 1-2 diet sodas, hot tea, occasional glass of wine  Pain with urination: No Fully empty bladder: No Stream: steady Urgency: Occasionally  Frequency: 6-10x Nocturia: occasionally 1x Leakage: Urge to void, Walking to the bathroom, Laughing, Exercise, Lifting, and Bending forward Pads: Yes:   Type: Large  Amount: 2-3x/day, not every time the pad is soiled Bladder control (0-10):  5/10  GASTROINTESTINAL HISTORY  Pain with bowel movement: Yes Type of bowel movement:Strain Yes, [place] Stool Chart: Type 2   Frequency: 2-3 days  Fully empty rectum: No Leakage: No   SEXUAL HISTORY/FUNCTION  Pain with intercourse: No  Able to achieve orgasm?: Yes   OBSTETRICAL HISTORY Vaginal deliveries: G1P1 Tearing: Yes: two stitches Currently pregnant: No  GYNECOLOGICAL HISTORY Hysterectomy: no Pelvic Organ Prolapse: None Heaviness/pressure: yes, only when sitting on toilet    SUBJECTIVE:                                                                                                                                                                      Pt reports no changes from initial eval. Pt able to engage in penetrative sex and reported no pain/discomfort, but did feel different as this is first activity since giving birth.   PAIN:  Are you having pain? No   OBJECTIVE:   Neuromuscular Re-education:  Pre-treatment assessment (06/06/22)  COGNITION: Overall cognitive status: Within functional limits for tasks assessed     POSTURE:  Grossly forward trunk flexion, crossed legs B and B plantarflexion  Iliac crest height: WNL Pelvic obliquity: WNL    RANGE OF MOTION:   (Norm range in degrees)  LEFT RIGHT   Lumbar forward flexion (65):  WNL    Lumbar extension (30): WNL*    Lumbar lateral flexion (25):  WNL WNL  Thoracic and Lumbar rotation (30 degrees):    WNL WNL  Hip Flexion (0-125):   WNL WNL  Hip IR (0-45):  WNL* WNL*  Hip ER (0-45):  WNL WNL  Hip Adduction:      Hip Abduction (0-40):  WNL WNL  Hip extension (0-15):     (*= pain, Blank rows = not tested)   STRENGTH: MMT    RLE LLE   Hip Flexion 4 4  Hip Extension 4 4  Hip Abduction     Hip Adduction     Hip ER  4 4  Hip IR  4* 4  Knee Extension 5 5  Knee Flexion 4 4  Dorsiflexion     Plantarflexion (  seated) 5 5  (*= pain, Blank rows = not tested)   SPECIAL TESTS:  FABER (SN 81):  negative FADIR (SN 94): negative    PALPATION:  Abdominal:  Diastasis: none, 2 fingers above, 2.5 at umbilicus, 2 fingers below umbilicus   RLQ - tenderness upon deep palpation (where Pt has hx of cysts)   EXTERNAL PELVIC EXAM: Patient educated on the purpose of the pelvic exam and articulated understanding; patient consented to the exam verbally.  Breath coordination: present but inconsistent  Voluntary Contraction: present, 1/5 MMT with abdominal bracing and Valsalva Relaxation: full  Perineal movement with sustained IAP increase ("bear down"): descent with Valsalva Perineal movement with rapid IAP increase ("cough"): no change (0= no contraction, 1= flicker, 2= weak squeeze, 3= fair squeeze with lift, 4= good squeeze and lift against resistance, 5= strong squeeze against strong resistance)    Neuromuscular Re-education: Review of bladder diary and episodes of leakage correlated with increased IAP (coughing, sudden movement during photography session)   Supine hooklying diaphragmatic breathing with VCs and TCs for downregulation of the nervous system and improved management of IAP Discussion on pursed-lip breathing vs. mouth breathing to avoid Valsalva maneuver during active interventions   [facility] abdominal rehab   Supine hooklying TrA contraction with coordinated exhale    Discussion and demonstration on log roll technique for improved IAP management   Discussion on how to position herself during breastfeeding and how to avoid rapid forward trunk flexion when finished breastfeeding. Will discuss other techniques and lifting mechanics in future sessions.    Patient response to interventions: Pt felt a tightening in the lower abdominals during TrA contraction but felt more discomfort on the R. After cueing to decrease intensity of contraction, discomfort eased.    Patient Education:  Patient provided with HEP including: supine diaphragmatic breathing and supine TrA  contraction. Patient verbalized understanding and returned demonstration. Patient will benefit from further education in order to maximize compliance and understanding for long-term therapeutic gains.    ASSESSMENT:  Clinical Impression: Patient presents with excellent motivation to participate in today's session. Upon physical assessment, Pt demonstrates deficits in IAP management, PFM coordination, PFM strength, PFM endurance, LE strength, posture and pain as evidenced by B rounded shoulders in sitting, discomfort with lumbar extension and B lateral flexion in the low back, pain at lateral hip with R IR MMT, LE weakness in hip flexion/ext/IR/ER and knee flexion, tenderness upon palpation in the RLQ, increased Valsalva maneuver throughout assessment, present but inconsistent breath coordination, 1/5 MMT PFM with increased abdominal bracing and Valsalva, and Valsalva maneuver with perineal movement with sustained IAP increase ("bear down"). Pt required significant VCs and TCs for proper diaphragmatic breathing technique and TrA activation. Brief discussion on positional techniques during breastfeeding as Pt reports having to continuously forward flex trunk. Will continue to assess in future visits. Pt responded well to active and educational interventions. Patient will continue to benefit from skilled therapeutic intervention to address deficits in IAP management, LE strength, PFM coordination, PFM strength, PFM endurance, posture and pain in order to increase PLOF and improve overall QOL.    Objective Impairments: decreased activity tolerance, decreased coordination, decreased endurance, decreased strength, increased fascial restrictions, improper body mechanics, postural dysfunction, and pain.   Activity Limitations: lifting, bending, squatting, continence, toileting, locomotion level, and caring for others  Personal Factors: Behavior pattern, Past/current experiences, Time since onset of  injury/illness/exacerbation, and 3+ comorbidities: interstitial cystitis, chronic Has, and Kawasaki disease (heart)  are also affecting patient's functional outcome.  Rehab Potential: Good  Clinical Decision Making: Evolving/moderate complexity  Evaluation Complexity: Moderate   GOALS: Goals reviewed with patient? Yes  SHORT TERM GOALS: Target date: 07/18/2022  Patient will demonstrate independence with HEP in order to maximize therapeutic gains and improve carryover from physical therapy sessions to ADLs in the home and community. Baseline: toileting posture and bladder irritant handout Goal status: INITIAL   LONG TERM GOALS: Target date: 08/29/2022   Patient will report less than 5 incidents of stress urinary incontinence over the course of 3 weeks while coughing/sneezing/laughing/prolonged activity in order to demonstrate improved PFM coordination, strength, and function for improved overall QOL. Baseline: every time she laughs/squats/physical activity/bending Goal status: INITIAL  2.  Patient will report decreased reliance on protective undergarments as indicated by </= a 24 hour period to demonstrate improved bladder control and allow for increased participation in activities outside of the home. Baseline: 2-3x/day large pads  Goal status: INITIAL  3.  Patient will report confidence in ability to control bladder > 8/10 in order to demonstrate improved function and ability to participate more fully in activities at home and in the community. Baseline: 5/10 Goal status: INITIAL  4.  Patient will demonstrate circumferential and sequential contraction of >4/5 MMT, > 5 sec hold x5 and 5 consecutive quick flicks with </= 10 min rest between testing bouts, and relaxation of the PFM coordinated with breath for improved management of intra-abdominal pressure and normal bowel and bladder function without the presence of pain nor incontinence in order to improve participation at home and in  the community. Baseline: will assess next visit Goal status: INITIAL  5.  Patient will report being able to return to activities including, but not limited to: jogging, running, squatting, physical activity without limitation or disruption to indicate complete resolution of the chief concern and return to prior level of participation at home and in the community. Baseline: cannot participate in above activities due to leakage Goal status: INITIAL  6.  Patient will be able to demonstrate and verbalize pain modulation/mindfulness techniques in order to reduce worst pain by 2 points given on the NPRS scale.  Baseline: 8/10 R abdominal pain with ovarian cysts or IC flare-ups Goal status: INITIAL   7. Patient will score >/= 61 and 57 on FOTO Urinary Problem and Bowel Constipation in order to demonstrate improved IAP management, PFM strength, PFM coordination, and overall improved QOL.   Baseline: 49 and 47  Goal status: INITIAL   PLAN: PT Frequency: 1x/week  PT Duration: 12 weeks  Planned Interventions: Therapeutic exercises, Therapeutic activity, Neuromuscular re-education, Balance training, Gait training, Patient/Family education, Joint mobilization, Spinal mobilization, Cryotherapy, Moist heat, scar mobilization, Taping, and Manual therapy  Plan For Next Session: myofascial technique at RLQ, positional techniques or going over breathing while getting in/out of positions, continue deep core   Caylynn Minchew, PT, DPT  06/06/2022, 1:59 PM

## 2022-06-13 ENCOUNTER — Ambulatory Visit: Payer: BC Managed Care – PPO

## 2022-06-13 DIAGNOSIS — R278 Other lack of coordination: Secondary | ICD-10-CM

## 2022-06-13 DIAGNOSIS — R1030 Lower abdominal pain, unspecified: Secondary | ICD-10-CM

## 2022-06-13 DIAGNOSIS — M6281 Muscle weakness (generalized): Secondary | ICD-10-CM

## 2022-06-13 DIAGNOSIS — M6289 Other specified disorders of muscle: Secondary | ICD-10-CM

## 2022-06-13 NOTE — Therapy (Signed)
OUTPATIENT PHYSICAL THERAPY FEMALE PELVIC TREATMENT   Patient Name: Judith Chapman MRN: [id number] DOB:Jun 29, 1991, 31 y.o., female Today's Date: 06/13/2022   PT End of Session - 06/13/22 1318     Visit Number 3    Number of Visits 12    Date for PT Re-Evaluation 08/22/22    PT Start Time 1320    PT Stop Time 1400    PT Time Calculation (min) 40 min    Activity Tolerance Patient tolerated treatment well             Past Medical History:  Diagnosis Date   Asthma    Cystitis, interstitial    Frequent headaches    Pelvic pain in female    Past Surgical History:  Procedure Laterality Date   CYSTO WITH HYDRODISTENSION N/A 08/27/2015   Procedure: CYSTOSCOPY HYDRODISTENSION AND INSTTILLATION OF MARCAINE AND PRYDIUM ;  Surgeon: Alfredo Martinez, MD;  Location: [facility];  Service: Urology;  Laterality: N/A;   CYSTO/  HYDRODISTENTION/  BLADDER BX  02-28-2012   TONSILLECTOMY     WISDOM TOOTH EXTRACTION     Patient Active Problem List   Diagnosis Date Noted   Amniotic fluid leaking 04/06/2022   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 No  OCCUPATION/SOCIAL ACTIVITIES: Photographer  PLOF: Independent   CHIEF CONCERN: After birth in May 2023, Pt had to wear briefs because she had severe episodes of leakage and she could not tell when she was going to leak. Pt  is currently still wearing pads due to the urinary leakage but it is not like it was right after giving birth. Pt tried jogging the other day and she had to stop because she began leaking. Pt has a hx of interstitial cystitis and ovarian cysts that rupture causing a lot of pain. Pt feels when they rupture and feels it particularly on the R sided lower abdominals with occasional radiation to the vagina. The feeling is described as localized to the R and tight but becomes a "sharp, stabbing" pain. Pt reports not being able to move much when these episodes happen and finds the fetal position and a heating pad to bring some relief. Pt has been concerned with appendicitis but has been ruled out by providers. Pt has taken [facility] since she was 16 and when she stopped taking them in 2021 to get pregnant, she did not notice an increase or decrease in cysts. Pt currently has an IUD placement. Pt's last episode of pain was August of 2022 (before being pregnant). Pt reports 8/10 on NPRS scale and has been to the hospital many times due to the pain in the R abdomen. Pt also has a hx frequent UTI's but has been able to tell the difference between a UTI vs. an IC flare-up. Pt has urinary leakage with squatting, jogging, and laughing. In addition, Pt has had a hx of constipation.    LIVING  ENVIRONMENT: Lives with: lives with their family Lives in: House/apartment   PATIENT GOALS: Being able to manage pain when it arises from IC, not having leakage or having to wear pads     UROLOGICAL HISTORY Fluid intake: 4-5 16oz water, 1-2 diet sodas, hot tea, occasional glass of wine  Pain with urination: No Fully empty bladder: No Stream: steady Urgency: Occasionally  Frequency: 6-10x Nocturia: occasionally 1x Leakage: Urge to void, Walking to the bathroom, Laughing, Exercise, Lifting, and Bending forward Pads: Yes:   Type: Large  Amount: 2-3x/day, not every time the pad is soiled Bladder control (0-10):  5/10  GASTROINTESTINAL HISTORY  Pain with bowel movement: Yes Type of bowel movement:Strain Yes, [place] Stool Chart: Type 2   Frequency: 2-3 days  Fully empty rectum: No Leakage: No   SEXUAL HISTORY/FUNCTION  Pain with intercourse: No  Able to achieve orgasm?: Yes   OBSTETRICAL HISTORY Vaginal deliveries: G1P1 Tearing: Yes: two stitches Currently pregnant: No  GYNECOLOGICAL HISTORY Hysterectomy: no Pelvic Organ Prolapse: None Heaviness/pressure: yes, only when sitting on toilet    SUBJECTIVE:                                                                                                                                                                      Pt has been able to perform TrA activation and really able to pay attention to the subtle tightening. In addition, Pt was a bit sore one day after performing a few repetitions of TrA activation. Soreness went away the next day.   PAIN:  Are you having pain? No   OBJECTIVE:    06/06/22  COGNITION: Overall cognitive status: Within functional limits for tasks assessed     POSTURE:  Grossly forward trunk flexion, crossed legs B and B plantarflexion  Iliac crest height: WNL Pelvic obliquity: WNL    RANGE OF MOTION:   (Norm range in degrees)  LEFT RIGHT   Lumbar forward flexion (65):  WNL    Lumbar extension (30): WNL*    Lumbar lateral flexion (25):  WNL WNL  Thoracic and Lumbar rotation (30 degrees):    WNL WNL  Hip Flexion (0-125):   WNL WNL  Hip IR (0-45):  WNL* WNL*  Hip ER (0-45):  WNL WNL  Hip Adduction:      Hip Abduction (0-40):  WNL WNL  Hip extension (0-15):     (*= pain, Blank rows = not tested)   STRENGTH: MMT    RLE LLE   Hip Flexion 4 4  Hip Extension 4 4  Hip Abduction     Hip Adduction     Hip ER  4 4  Hip IR  4* 4  Knee Extension 5 5  Knee Flexion 4 4  Dorsiflexion     Plantarflexion (seated) 5 5  (*= pain, Blank rows = not tested)   SPECIAL TESTS:  FABER (SN 81):  negative FADIR (SN 94): negative    PALPATION:  Abdominal:  Diastasis: none, 2 fingers above, 2.5 at umbilicus, 2 fingers below umbilicus   RLQ - tenderness upon deep palpation (where Pt has hx of cysts)   EXTERNAL PELVIC EXAM: Patient educated on the purpose of the pelvic exam and articulated understanding; patient consented to the exam verbally.  Breath coordination: present but inconsistent  Voluntary Contraction: present, 1/5 MMT with abdominal bracing and Valsalva Relaxation: full  Perineal movement with sustained IAP increase ("bear down"): descent with Valsalva Perineal movement with rapid IAP increase ("cough"): no change (0= no contraction, 1= flicker, 2= weak squeeze, 3= fair squeeze with lift, 4= good squeeze and lift against resistance, 5= strong squeeze against strong resistance)   TODAY'S TREATMENT:   Manual Therapy: Myofascial release at abdomen for improved tension specifically in the RLQ, paired with diaphragmatic breathing  Discomfort at RLQ with radiation towards center of abdomen  Discomfort eased with continued intervention   Neuromuscular Re-education: Positional techniques with coordinated breath for optimal positioning during breastfeeding for improved IAP management and decreased Vasalva maneuver   Supine hooklying diaphragmatic breathing with VCs and TCs for downregulation of the nervous system and improved management of IAP  Review of Sahrmann abdominal rehab with VCs and TCs  Supine hooklying TrA contraction with coordinated exhale  VCs to decrease forceful activation of rectus abdominus/obliques   Quadruped diaphragmatic breathing, VCs and TCs for improved descent of abdomen upon inhalation   Quadruped TrA activation with coordinated breath, VCs and TCs as needed     Patient response to interventions: Pt felt being in quadruped it was a bit more difficult to coordinate her breath and perform TrA activation without pulling in of abdomen.     Patient Education:  Patient provided with HEP including: positional technique for breastfeeding with breath, TrA activation in quadruped, quadruped diaphragmatic breathing. Patient educated throughout session on appropriate technique and form using multi-modal cueing, HEP, and activity modification. Patient will benefit from further education in order to maximize compliance and understanding for long-term therapeutic gains.    ASSESSMENT:  Clinical Impression: Patient presents with excellent motivation to participate in today's session. Pt continues to demonstrate deficits in IAP management, PFM coordination, PFM strength, PFM endurance, LE strength, posture and pain. Pt with discomfort upon myofascial release at the RLQ but discomfort eased with continued intervention. Upon palpation, Pt with decreased tension throughout the abdomen after manual technique. Pt required significant VCs and TCs for deep core progression and quadruped diaphragmatic breathing to decrease bodily compensations (forceful pull inwards of abdomen). Pt benefited from education and demonstration on positional techniques during breastfeeding and how to assume upright position without breath holding. Pt responded well to active, manual, and educational interventions. Patient will continue to benefit from skilled therapeutic intervention to address deficits in IAP management, LE strength, PFM coordination, PFM strength, PFM endurance, posture and pain in order to increase PLOF and improve overall QOL.    Objective Impairments: decreased activity tolerance, decreased coordination, decreased endurance, decreased strength, increased fascial restrictions, improper body mechanics, postural dysfunction, and pain.   Activity Limitations: lifting, bending, squatting, continence, toileting, locomotion level, and caring for others  Personal Factors: Behavior pattern, Past/current experiences, Time since onset of  injury/illness/exacerbation, and 3+ comorbidities: interstitial cystitis, chronic Has, and Kawasaki disease (heart)  are also affecting patient's functional outcome.  Rehab Potential: Good  Clinical Decision Making: Evolving/moderate complexity  Evaluation Complexity: Moderate   GOALS: Goals reviewed with patient? Yes  SHORT TERM GOALS: Target date: 07/25/2022  Patient will demonstrate independence with HEP in order to maximize therapeutic gains and improve carryover from physical therapy sessions to ADLs in the home and community. Baseline: toileting posture and bladder irritant handout Goal status: INITIAL   LONG TERM GOALS: Target date: 09/05/2022   Patient will report less than 5 incidents of stress urinary incontinence over the course of 3 weeks while coughing/sneezing/laughing/prolonged activity in order to demonstrate improved PFM coordination, strength, and function for improved overall QOL. Baseline: every time she laughs/squats/physical activity/bending Goal status: INITIAL  2.  Patient will report decreased reliance on protective undergarments as indicated by </= a 24 hour period to demonstrate improved bladder control and allow for increased participation in activities outside of the home. Baseline: 2-3x/day large pads  Goal status: INITIAL  3.  Patient will report confidence in ability to control bladder > 8/10 in order to demonstrate improved function and ability to participate more fully in activities at home and in the community. Baseline: 5/10 Goal status: INITIAL  4.  Patient will demonstrate circumferential and sequential contraction of >4/5 MMT, > 5 sec hold x5 and 5 consecutive quick flicks with </= 10 min rest between testing bouts, and relaxation of the PFM coordinated with breath for improved management of intra-abdominal pressure and normal bowel and bladder function without the presence of pain nor incontinence in order to improve participation at home and in  the community. Baseline: will assess next visit Goal status: INITIAL  5.  Patient will report being able to return to activities including, but not limited to: jogging, running, squatting, physical activity without limitation or disruption to indicate complete resolution of the chief concern and return to prior level of participation at home and in the community. Baseline: cannot participate in above activities due to leakage Goal status: INITIAL  6.  Patient will be able to demonstrate and verbalize pain modulation/mindfulness techniques in order to reduce worst pain by 2 points given on the NPRS scale.  Baseline: 8/10 R abdominal pain with ovarian cysts or IC flare-ups Goal status: INITIAL   7. Patient will score >/= 61 and 57 on FOTO Urinary Problem and Bowel Constipation in order to demonstrate improved IAP management, PFM strength, PFM coordination, and overall improved QOL.   Baseline: 49 and 47  Goal status: INITIAL   PLAN: PT Frequency: 1x/week  PT Duration: 12 weeks  Planned Interventions: Therapeutic exercises, Therapeutic activity, Neuromuscular re-education, Balance training, Gait training, Patient/Family education, Joint mobilization, Spinal mobilization, Cryotherapy, Moist heat, scar mobilization, Taping, and Manual therapy  Plan For Next Session: myofascial release, continue deep core, pain modulation techniques   Toniya Rozar, PT, DPT  06/13/2022, 1:21 PM

## 2022-06-20 ENCOUNTER — Ambulatory Visit: Payer: BC Managed Care – PPO

## 2022-06-20 DIAGNOSIS — R278 Other lack of coordination: Secondary | ICD-10-CM

## 2022-06-20 DIAGNOSIS — R1030 Lower abdominal pain, unspecified: Secondary | ICD-10-CM

## 2022-06-20 DIAGNOSIS — M6289 Other specified disorders of muscle: Secondary | ICD-10-CM

## 2022-06-20 DIAGNOSIS — M6281 Muscle weakness (generalized): Secondary | ICD-10-CM

## 2022-06-20 NOTE — Therapy (Signed)
OUTPATIENT PHYSICAL THERAPY FEMALE PELVIC TREATMENT   Patient Name: Judith Chapman MRN: [id number] DOB:Oct 02, 1991, 31 y.o., female Today's Date: 06/20/2022   PT End of Session - 06/20/22 1302     Visit Number 4    Number of Visits 12    Date for PT Re-Evaluation 08/22/22    PT Start Time 1305    PT Stop Time 1345    PT Time Calculation (min) 40 min    Activity Tolerance Patient tolerated treatment well             Past Medical History:  Diagnosis Date   Asthma    Cystitis, interstitial    Frequent headaches    Pelvic pain in female    Past Surgical History:  Procedure Laterality Date   CYSTO WITH HYDRODISTENSION N/A 08/27/2015   Procedure: CYSTOSCOPY HYDRODISTENSION AND INSTTILLATION OF MARCAINE AND PRYDIUM ;  Surgeon: Alfredo Martinez, MD;  Location: [facility] [place];  Service: Urology;  Laterality: N/A;   CYSTO/  HYDRODISTENTION/  BLADDER BX  02-28-2012   TONSILLECTOMY     WISDOM TOOTH EXTRACTION     Patient Active Problem List   Diagnosis Date Noted   Amniotic fluid leaking 04/06/2022   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 No  OCCUPATION/SOCIAL ACTIVITIES: Photographer  PLOF: Independent   CHIEF CONCERN: After birth in May 2023, Pt had to wear briefs because she had severe episodes of leakage and she could not tell when she was going to leak. Pt  is currently still wearing pads due to the urinary leakage but it is not like it was right after giving birth. Pt tried jogging the other day and she had to stop because she began leaking. Pt has a hx of interstitial cystitis and ovarian cysts that rupture causing a lot of pain. Pt feels when they rupture and feels it particularly on the R sided lower abdominals with occasional radiation to the vagina. The feeling is described as localized to the R and tight but becomes a "sharp, stabbing" pain. Pt reports not being able to move much when these episodes happen and finds the fetal position and a heating pad to bring some relief. Pt has been concerned with appendicitis but has been ruled out by providers. Pt has taken [facility] since she was 16 and when she stopped taking them in 2021 to get pregnant, she did not notice an increase or decrease in cysts. Pt currently has an IUD placement. Pt's last episode of pain was August of 2022 (before being pregnant). Pt reports 8/10 on NPRS scale and has been to the hospital many times due to the pain in the R abdomen. Pt also has a hx frequent UTI's but has been able to tell the difference between a UTI vs. an IC flare-up. Pt has urinary leakage with squatting, jogging, and laughing. In addition, Pt has had a hx of constipation.    LIVING  ENVIRONMENT: Lives with: lives with their family Lives in: House/apartment   PATIENT GOALS: Being able to manage pain when it arises from IC, not having leakage or having to wear pads     UROLOGICAL HISTORY Fluid intake: 4-5 16oz water, 1-2 diet sodas, hot tea, occasional glass of wine  Pain with urination: No Fully empty bladder: No Stream: steady Urgency: Occasionally  Frequency: 6-10x Nocturia: occasionally 1x Leakage: Urge to void, Walking to the bathroom, Laughing, Exercise, Lifting, and Bending forward Pads: Yes:   Type: Large  Amount: 2-3x/day, not every time the pad is soiled Bladder control (0-10):  5/10  GASTROINTESTINAL HISTORY  Pain with bowel movement: Yes Type of bowel movement:Strain Yes, [place] Stool Chart: Type 2   Frequency: 2-3 days  Fully empty rectum: No Leakage: No   SEXUAL HISTORY/FUNCTION  Pain with intercourse: No  Able to achieve orgasm?: Yes   OBSTETRICAL HISTORY Vaginal deliveries: G1P1 Tearing: Yes: two stitches Currently pregnant: No  GYNECOLOGICAL HISTORY Hysterectomy: no Pelvic Organ Prolapse: None Heaviness/pressure: yes, only when sitting on toilet    SUBJECTIVE:                                                                                                                                                                      Pt had a cramping sensation in the throughout the abdomen. Pt has been spotting since IUD placed 6 weeks post-partum. The cramping sensation eased with heating pad and increased rest. Pt has been able to practice techniques learned last session for breastfeeding and it has helped a lot. Pt has felt a decrease in leakage.   PAIN:  Are you having pain? No    OBJECTIVE:    06/06/22  COGNITION: Overall cognitive status: Within functional limits for tasks assessed     POSTURE:  Grossly forward trunk flexion, crossed legs B and B plantarflexion  Iliac crest height: WNL Pelvic obliquity: WNL    RANGE OF MOTION:   (Norm range in degrees)  LEFT RIGHT   Lumbar forward flexion (65):  WNL    Lumbar extension (30): WNL*    Lumbar lateral flexion (25):  WNL WNL  Thoracic and Lumbar rotation (30 degrees):    WNL WNL  Hip Flexion (0-125):   WNL WNL  Hip IR (0-45):  WNL* WNL*  Hip ER (0-45):  WNL WNL  Hip Adduction:      Hip Abduction (0-40):  WNL WNL  Hip extension (0-15):     (*= pain, Blank rows = not tested)   STRENGTH: MMT    RLE LLE   Hip Flexion 4 4  Hip Extension 4 4  Hip Abduction     Hip Adduction     Hip ER  4 4  Hip IR  4* 4  Knee Extension 5 5  Knee Flexion 4 4  Dorsiflexion     Plantarflexion  (seated) 5 5  (*= pain, Blank rows = not tested)   SPECIAL TESTS:  FABER (SN 81): negative FADIR (SN 94): negative    PALPATION:  Abdominal:  Diastasis: none, 2 fingers above, 2.5 at umbilicus, 2 fingers below umbilicus   RLQ - tenderness upon deep palpation (where Pt has hx of cysts)   EXTERNAL PELVIC EXAM: Patient educated on the purpose of the pelvic exam and articulated understanding; patient consented to the exam verbally.  Breath coordination: present but inconsistent  Voluntary Contraction: present, 1/5 MMT with abdominal bracing and Valsalva Relaxation: full  Perineal movement with sustained IAP increase ("bear down"): descent with Valsalva Perineal movement with rapid IAP increase ("cough"): no change (0= no contraction, 1= flicker, 2= weak squeeze, 3= fair squeeze with lift, 4= good squeeze and lift against resistance, 5= strong squeeze against strong resistance)    TODAY'S TREATMENT:   Manual Therapy: Myofascial release at abdomen for improved tension specifically in the center of the abdomen and upper quadrants paired with diaphragmatic breathing  Discomfort at center of abdomen but relieved with increased time   Neuromuscular Re-education: Supine hooklying diaphragmatic breathing with VCs and TCs for downregulation of the nervous system and improved management of IAP  Supine hooklying PFM lengthening techniques with diaphragmatic breathing and for pain modulation, VCs and TCs as needed              B Single knee to chest              "Happy baby" pose   Child's pose   Cat/cow   Prone cobra with pillow underneath lower abdomen   Sidelying open book rotations with coordinated breath, B x15, for improved lengthening of the anterior fascial slings   Discussion on pain neuroscience and the purpose of downregulating the nervous system/acute vs. chronic pain has Pt has hx of IC  Patient response to interventions: Pt felt it was harder to perform the open book on  the R side.    Patient Education:  Patient provided with HEP including: various pain modulation techniques/PFM lengthening (see above). Patient educated throughout session on appropriate technique and form using multi-modal cueing, HEP, and activity modification. Patient will benefit from further education in order to maximize compliance and understanding for long-term therapeutic gains.    ASSESSMENT:  Clinical Impression: Patient presents with excellent motivation to participate in today's session. Pt continues to demonstrate deficits in IAP management, PFM coordination, PFM strength, PFM endurance, LE strength, posture and pain. Pt reports having a small bout of pain this past week described as cramping across the abdomen particularly at the RLQ. Upon palpation, Pt with increased tension in the center of abdomen and upper quadrants, which relieved with increased time. Pt required moderate VCs and TCs to decrease bodily compensations during pain modulation/PFM lengthening techniques. Pt responded positively to all active, manual, and educational interventions. Patient will continue to benefit from skilled therapeutic intervention to address deficits in IAP management, LE strength, PFM coordination, PFM strength, PFM endurance, posture and pain in order to increase PLOF and improve overall QOL.    Objective Impairments: decreased activity tolerance, decreased coordination, decreased endurance, decreased strength, increased fascial restrictions, improper body mechanics, postural dysfunction, and pain.   Activity Limitations: lifting, bending, squatting, continence, toileting, locomotion level, and caring for others  Personal Factors: Behavior pattern, Past/current experiences, Time since onset of  injury/illness/exacerbation, and 3+ comorbidities: interstitial cystitis, chronic Has, and Kawasaki disease (heart)  are also affecting patient's functional outcome.   Rehab Potential: Good  Clinical  Decision Making: Evolving/moderate complexity  Evaluation Complexity: Moderate   GOALS: Goals reviewed with patient? Yes  SHORT TERM GOALS: Target date: 08/01/2022  Patient will demonstrate independence with HEP in order to maximize therapeutic gains and improve carryover from physical therapy sessions to ADLs in the home and community. Baseline: toileting posture and bladder irritant handout Goal status: INITIAL   LONG TERM GOALS: Target date: 09/12/2022   Patient will report less than 5 incidents of stress urinary incontinence over the course of 3 weeks while coughing/sneezing/laughing/prolonged activity in order to demonstrate improved PFM coordination, strength, and function for improved overall QOL. Baseline: every time she laughs/squats/physical activity/bending Goal status: INITIAL  2.  Patient will report decreased reliance on protective undergarments as indicated by </= a 24 hour period to demonstrate improved bladder control and allow for increased participation in activities outside of the home. Baseline: 2-3x/day large pads  Goal status: INITIAL  3.  Patient will report confidence in ability to control bladder > 8/10 in order to demonstrate improved function and ability to participate more fully in activities at home and in the community. Baseline: 5/10 Goal status: INITIAL  4.  Patient will demonstrate circumferential and sequential contraction of >4/5 MMT, > 5 sec hold x5 and 5 consecutive quick flicks with </= 10 min rest between testing bouts, and relaxation of the PFM coordinated with breath for improved management of intra-abdominal pressure and normal bowel and bladder function without the presence of pain nor incontinence in order to improve participation at home and in the community. Baseline: (7/17): 1/5 MMT/no endurance tested Goal status: INITIAL  5.  Patient will report being able to return to activities including, but not limited to: jogging, running,  squatting, physical activity without limitation or disruption to indicate complete resolution of the chief concern and return to prior level of participation at home and in the community. Baseline: cannot participate in above activities due to leakage Goal status: INITIAL  6.  Patient will be able to demonstrate and verbalize pain modulation/mindfulness techniques in order to reduce worst pain by 2 points given on the NPRS scale.  Baseline: 8/10 R abdominal pain with ovarian cysts or IC flare-ups Goal status: INITIAL   7. Patient will score >/= 61 and 57 on FOTO Urinary Problem and Bowel Constipation in order to demonstrate improved IAP management, PFM strength, PFM coordination, and overall improved QOL.   Baseline: 49 and 47  Goal status: INITIAL   PLAN: PT Frequency: 1x/week  PT Duration: 12 weeks  Planned Interventions: Therapeutic exercises, Therapeutic activity, Neuromuscular re-education, Balance training, Gait training, Patient/Family education, Joint mobilization, Spinal mobilization, Cryotherapy, Moist heat, scar mobilization, Taping, and Manual therapy  Plan For Next Session: STSs with breath, how did pain techniques go? Did you try massage, lifting mecahnics  Oveda Dadamo, PT, DPT  06/20/2022, 1:02 PM

## 2022-06-27 ENCOUNTER — Ambulatory Visit: Payer: BC Managed Care – PPO | Attending: Obstetrics and Gynecology

## 2022-06-27 DIAGNOSIS — M6289 Other specified disorders of muscle: Secondary | ICD-10-CM | POA: Diagnosis present

## 2022-06-27 DIAGNOSIS — R278 Other lack of coordination: Secondary | ICD-10-CM

## 2022-06-27 DIAGNOSIS — R1030 Lower abdominal pain, unspecified: Secondary | ICD-10-CM

## 2022-06-27 DIAGNOSIS — M6281 Muscle weakness (generalized): Secondary | ICD-10-CM | POA: Diagnosis present

## 2022-06-27 NOTE — Therapy (Signed)
OUTPATIENT PHYSICAL THERAPY FEMALE PELVIC TREATMENT   Patient Name: Judith Chapman MRN: [id number] DOB:1991/04/09, 31 y.o., female Today's Date: 06/27/2022   PT End of Session - 06/27/22 1306     Visit Number 5    Number of Visits 12    Date for PT Re-Evaluation 08/22/22    PT Start Time 1310    PT Stop Time 1350    PT Time Calculation (min) 40 min    Activity Tolerance Patient tolerated treatment well             Past Medical History:  Diagnosis Date   Asthma    Cystitis, interstitial    Frequent headaches    Pelvic pain in female    Past Surgical History:  Procedure Laterality Date   CYSTO WITH HYDRODISTENSION N/A 08/27/2015   Procedure: CYSTOSCOPY HYDRODISTENSION AND INSTTILLATION OF [place] AND [place] ;  Surgeon: Bjorn Loser, MD;  Location: [facility];  Service: Urology;  Laterality: N/A;   CYSTO/  HYDRODISTENTION/  BLADDER BX  02-28-2012   TONSILLECTOMY     WISDOM TOOTH EXTRACTION     Patient Active Problem List   Diagnosis Date Noted   Amniotic fluid leaking 04/06/2022   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 No  OCCUPATION/SOCIAL ACTIVITIES: Photographer  PLOF: Independent   CHIEF CONCERN: After birth in May 2023, Pt had to wear briefs because she had severe episodes of leakage and she could not tell when she was going to leak. Pt  is currently still wearing pads due to the urinary leakage but it is not like it was right after giving birth. Pt tried jogging the other day and she had to stop because she began leaking. Pt has a hx of interstitial cystitis and ovarian cysts that rupture causing a lot of pain. Pt feels when they rupture and feels it particularly on the R sided lower abdominals with occasional radiation to the vagina. The feeling is described as localized to the R and tight but becomes a "sharp, stabbing" pain. Pt reports not being able to move much when these episodes happen and finds the fetal position and a heating pad to bring some relief. Pt has been concerned with appendicitis but has been ruled out by providers. Pt has taken [facility] since she was 53 and when she stopped taking them in 2021 to get pregnant, she did not notice an increase or decrease in cysts. Pt currently has an IUD placement. Pt's last episode of pain was August of 2022 (before being pregnant). Pt reports 8/10 on NPRS scale and has been to the hospital many times due to the pain in the R abdomen. Pt also has a hx frequent UTI's but has been able to tell the difference between a UTI vs. an IC flare-up. Pt has urinary leakage with squatting, jogging, and laughing. In addition, Pt has had a hx of constipation.    LIVING  ENVIRONMENT: Lives with: lives with their family Lives in: House/apartment   PATIENT GOALS: Being able to manage pain when it arises from IC, not having leakage or having to wear pads     UROLOGICAL HISTORY Fluid intake: 4-5 16oz water, 1-2 diet sodas, hot tea, occasional glass of wine  Pain with urination: No Fully empty bladder: No Stream: steady Urgency: Occasionally  Frequency: 6-10x Nocturia: occasionally 1x Leakage: Urge to void, Walking to the bathroom, Laughing, Exercise, Lifting, and Bending forward Pads: Yes:   Type: Large  Amount: 2-3x/day, not every time the pad is soiled Bladder control (0-10):  5/10  GASTROINTESTINAL HISTORY  Pain with bowel movement: Yes Type of bowel movement:Strain Yes, [place] Stool Chart: Type 2   Frequency: 2-3 days  Fully empty rectum: No Leakage: No   SEXUAL HISTORY/FUNCTION  Pain with intercourse: No  Able to achieve orgasm?: Yes   OBSTETRICAL HISTORY Vaginal deliveries: G1P1 Tearing: Yes: two stitches Currently pregnant: No  GYNECOLOGICAL HISTORY Hysterectomy: no Pelvic Organ Prolapse: None Heaviness/pressure: yes, only when sitting on toilet    SUBJECTIVE:                                                                                                                                                                      Pt has not had any pain flare-ups. Pt has been practicing PFM lengthening and pain modulation techniques which she enjoys child's pose and thoracic rotation.    PAIN:  Are you having pain? No    OBJECTIVE:    06/06/22  COGNITION: Overall cognitive status: Within functional limits for tasks assessed     POSTURE:  Grossly forward trunk flexion, crossed legs B and B plantarflexion  Iliac crest height: WNL Pelvic obliquity: WNL    RANGE OF MOTION:   (Norm range in degrees)  LEFT RIGHT   Lumbar forward flexion (65):  WNL    Lumbar extension (30): WNL*    Lumbar lateral flexion (25):  WNL WNL  Thoracic and Lumbar rotation (30 degrees):    WNL WNL  Hip Flexion (0-125):   WNL WNL  Hip IR (0-45):  WNL* WNL*  Hip ER (0-45):  WNL WNL  Hip Adduction:      Hip Abduction (0-40):  WNL WNL  Hip extension (0-15):     (*= pain, Blank rows = not tested)   STRENGTH: MMT    RLE LLE   Hip Flexion 4 4  Hip Extension 4 4  Hip Abduction     Hip Adduction     Hip ER  4 4  Hip IR  4* 4  Knee Extension 5 5  Knee Flexion 4 4  Dorsiflexion     Plantarflexion (seated) 5 5  (*= pain, Blank  rows = not tested)   SPECIAL TESTS:  FABER (SN 81): negative FADIR (SN 94): negative    PALPATION:  Abdominal:  Diastasis:  none, 2 fingers above, 2.5 at umbilicus, 2 fingers below umbilicus   RLQ - tenderness upon deep palpation (where Pt has hx of cysts)   EXTERNAL PELVIC EXAM: Patient educated on the purpose of the pelvic exam and articulated understanding; patient consented to the exam verbally.  Breath coordination: present but inconsistent  Voluntary Contraction: present, 1/5 MMT with abdominal bracing and Valsalva Relaxation: full  Perineal movement with sustained IAP increase ("bear down"): descent with Valsalva Perineal movement with rapid IAP increase ("cough"): no change (0= no contraction, 1= flicker, 2= weak squeeze, 3= fair squeeze with lift, 4= good squeeze and lift against resistance, 5= strong squeeze against strong resistance)    TODAY'S TREATMENT:    Neuromuscular Re-education: Supine hooklying diaphragmatic breathing with VCs and TCs for downregulation of the nervous system and improved management of IAP  Supine hooklying TrA activation with UE challenge (supine punch) for improved IAP management   Quadruped TrA activation with UE challenge for improved IAP management   Sit<>supine transfers with coordinated breath for decreased Valsalva maneuver and improved IAP management   Sit to stands with coordinate breath for improved IAP management, VCs and TCs as needed for proper technique  Lifting techniques for improved IAP management and decreased Valsalva maneuver  Staggered stance and simulated height of playpen and changing table  Practiced lifting from floor, deep squat and semi lunge position  Patient response to interventions: Pt had slight leakage with squatting technique.    Patient Education:  Patient provided with HEP including: supine TrA activation with punch, quadruped TrA activation with UE challenge. Patient educated throughout session on appropriate technique and form using multi-modal cueing, HEP, and activity modification. Patient will benefit from further education in  order to maximize compliance and understanding for long-term therapeutic gains.    ASSESSMENT:  Clinical Impression: Patient presents with excellent motivation to participate in today's session. Pt continues to demonstrate deficits in IAP management, PFM coordination, PFM strength, PFM endurance, LE strength, posture and pain. Pt with no pain flare-ups this past week. Pt does report she feels she holds her breath a lot during movements especially when picking up newborn. Time dedicated to proper body mechanics with coordinated breath to simulate lifting heavy objects and newborn as they continue to grow. Pt required moderate VCs and TCs for proper technique and decreased bodily compensations (increased lumbar lordosis and Valsalva). With increased repetition, Pt demonstrated improvement. Pt able to progress TrA activation in various positions. Pt responded positively to all active and educational interventions. Patient will continue to benefit from skilled therapeutic intervention to address deficits in IAP management, LE strength, PFM coordination, PFM strength, PFM endurance, posture and pain in order to increase PLOF and improve overall QOL.    Objective Impairments: decreased activity tolerance, decreased coordination, decreased endurance, decreased strength, increased fascial restrictions, improper body mechanics, postural dysfunction, and pain.   Activity Limitations: lifting, bending, squatting, continence, toileting, locomotion level, and caring for others  Personal Factors: Behavior pattern, Past/current experiences, Time since onset of injury/illness/exacerbation, and 3+ comorbidities: interstitial cystitis, chronic Has, and Kawasaki disease (heart)  are also affecting patient's functional outcome.   Rehab Potential: Good  Clinical Decision Making: Evolving/moderate complexity  Evaluation Complexity: Moderate   GOALS: Goals reviewed with patient? Yes  SHORT TERM GOALS: Target  date: 08/08/2022  Patient will demonstrate independence with HEP in order  to maximize therapeutic gains and improve carryover from physical therapy sessions to ADLs in the home and community. Baseline: toileting posture and bladder irritant handout Goal status: INITIAL   LONG TERM GOALS: Target date: 09/19/2022   Patient will report less than 5 incidents of stress urinary incontinence over the course of 3 weeks while coughing/sneezing/laughing/prolonged activity in order to demonstrate improved PFM coordination, strength, and function for improved overall QOL. Baseline: every time she laughs/squats/physical activity/bending Goal status: INITIAL  2.  Patient will report decreased reliance on protective undergarments as indicated by </= a 24 hour period to demonstrate improved bladder control and allow for increased participation in activities outside of the home. Baseline: 2-3x/day large pads  Goal status: INITIAL  3.  Patient will report confidence in ability to control bladder > 8/10 in order to demonstrate improved function and ability to participate more fully in activities at home and in the community. Baseline: 5/10 Goal status: INITIAL  4.  Patient will demonstrate circumferential and sequential contraction of >4/5 MMT, > 5 sec hold x5 and 5 consecutive quick flicks with </= 10 min rest between testing bouts, and relaxation of the PFM coordinated with breath for improved management of intra-abdominal pressure and normal bowel and bladder function without the presence of pain nor incontinence in order to improve participation at home and in the community. Baseline: (7/17): 1/5 MMT/no endurance tested Goal status: INITIAL  5.  Patient will report being able to return to activities including, but not limited to: jogging, running, squatting, physical activity without limitation or disruption to indicate complete resolution of the chief concern and return to prior level of participation at  home and in the community. Baseline: cannot participate in above activities due to leakage Goal status: INITIAL  6.  Patient will be able to demonstrate and verbalize pain modulation/mindfulness techniques in order to reduce worst pain by 2 points given on the NPRS scale.  Baseline: 8/10 R abdominal pain with ovarian cysts or IC flare-ups Goal status: INITIAL   7. Patient will score >/= 61 and 57 on FOTO Urinary Problem and Bowel Constipation in order to demonstrate improved IAP management, PFM strength, PFM coordination, and overall improved QOL.   Baseline: 49 and 47  Goal status: INITIAL   PLAN: PT Frequency: 1x/week  PT Duration: 12 weeks  Planned Interventions: Therapeutic exercises, Therapeutic activity, Neuromuscular re-education, Balance training, Gait training, Patient/Family education, Joint mobilization, Spinal mobilization, Cryotherapy, Moist heat, scar mobilization, Taping, and Manual therapy  Plan For Next Session: FOTO, standing core, how was work/lifting mechanics  [employer], PT, DPT  06/27/2022, 1:07 PM

## 2022-07-04 ENCOUNTER — Ambulatory Visit: Payer: BC Managed Care – PPO

## 2022-07-05 ENCOUNTER — Ambulatory Visit: Payer: BC Managed Care – PPO

## 2022-07-05 DIAGNOSIS — M6289 Other specified disorders of muscle: Secondary | ICD-10-CM

## 2022-07-05 DIAGNOSIS — M6281 Muscle weakness (generalized): Secondary | ICD-10-CM

## 2022-07-05 DIAGNOSIS — R1030 Lower abdominal pain, unspecified: Secondary | ICD-10-CM

## 2022-07-05 DIAGNOSIS — R278 Other lack of coordination: Secondary | ICD-10-CM

## 2022-07-05 NOTE — Therapy (Signed)
OUTPATIENT PHYSICAL THERAPY FEMALE PELVIC TREATMENT   Patient Name: Judith Chapman MRN: [id number] DOB:Mar 20, 1991, 31 y.o., female Today's Date: 07/05/2022   PT End of Session - 07/05/22 0843     Visit Number 6    Number of Visits 12    Date for PT Re-Evaluation 08/22/22    Authorization Type IE: 05/30/22    PT Start Time 0845    PT Stop Time 0925    PT Time Calculation (min) 40 min    Activity Tolerance Patient tolerated treatment well             Past Medical History:  Diagnosis Date   Asthma    Cystitis, interstitial    Frequent headaches    Pelvic pain in female    Past Surgical History:  Procedure Laterality Date   CYSTO WITH HYDRODISTENSION N/A 08/27/2015   Procedure: CYSTOSCOPY HYDRODISTENSION AND INSTTILLATION OF MARCAINE AND PRYDIUM ;  Surgeon: Alfredo Martinez, MD;  Location: [facility];  Service: Urology;  Laterality: N/A;   CYSTO/  HYDRODISTENTION/  BLADDER BX  02-28-2012   TONSILLECTOMY     WISDOM TOOTH EXTRACTION     Patient Active Problem List   Diagnosis Date Noted   Amniotic fluid leaking 04/06/2022   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 No  OCCUPATION/SOCIAL ACTIVITIES: Photographer  PLOF: Independent   CHIEF CONCERN: After birth in May 2023, Pt had to wear briefs because she had severe episodes of leakage and she could not  tell when she was going to leak. Pt is currently still wearing pads due to the urinary leakage but it is not like it was right after giving birth. Pt tried jogging the other day and she had to stop because she began leaking. Pt has a hx of interstitial cystitis and ovarian cysts that rupture causing a lot of pain. Pt feels when they rupture and feels it particularly on the R sided lower abdominals with occasional radiation to the vagina. The feeling is described as localized to the R and tight but becomes a "sharp, stabbing" pain. Pt reports not being able to move much when these episodes happen and finds the fetal position and a heating pad to bring some relief. Pt has been concerned with appendicitis but has been ruled out by providers. Pt has taken [facility] since she was 16 and when she stopped taking them in 2021 to get pregnant, she did not notice an increase or decrease in cysts. Pt currently has an IUD placement. Pt's last episode of pain was August of 2022 (before being pregnant). Pt reports 8/10 on NPRS scale and has been to the hospital many times due to the pain in the R abdomen. Pt also has a hx frequent UTI's but has been able to tell the difference between a UTI vs. an IC flare-up. Pt has urinary leakage with squatting, jogging, and laughing. In addition, Pt has had a  hx of constipation.    LIVING ENVIRONMENT: Lives with: lives with their family Lives in: House/apartment   PATIENT GOALS: Being able to manage pain when it arises from IC, not having leakage or having to wear pads     UROLOGICAL HISTORY Fluid intake: 4-5 16oz water, 1-2 diet sodas, hot tea, occasional glass of wine  Pain with urination: No Fully empty bladder: No Stream: steady Urgency: Occasionally  Frequency: 6-10x Nocturia: occasionally 1x Leakage: Urge to void, Walking to the bathroom, Laughing, Exercise, Lifting, and Bending forward Pads: Yes:   Type: Large  Amount: 2-3x/day, not every time the pad is soiled Bladder  control (0-10): 5/10  GASTROINTESTINAL HISTORY  Pain with bowel movement: Yes Type of bowel movement:Strain Yes, [place] Stool Chart: Type 2   Frequency: 2-3 days  Fully empty rectum: No Leakage: No   SEXUAL HISTORY/FUNCTION  Pain with intercourse: No  Able to achieve orgasm?: Yes   OBSTETRICAL HISTORY Vaginal deliveries: G1P1 Tearing: Yes: two stitches Currently pregnant: No  GYNECOLOGICAL HISTORY Hysterectomy: no Pelvic Organ Prolapse: None Heaviness/pressure: yes, only when sitting on toilet    SUBJECTIVE:                                                                                                                                                                      Pt did have an episode of abdominal pain but it was more localized to the center and not the R. Pt did try massage techniques and PFM lengthening and that helped the pain not increase in intensity. Pt did have a recent appt with OBGYN in regards to IUD and spotting is normal based on what Dr said up to 3 months of IUD insertion.    PAIN:  Are you having pain? No    OBJECTIVE:    06/06/22  COGNITION: Overall cognitive status: Within functional limits for tasks assessed     POSTURE:  Grossly forward trunk flexion, crossed legs B and B plantarflexion  Iliac crest height: WNL Pelvic obliquity: WNL    RANGE OF MOTION:   (Norm range in degrees)  LEFT RIGHT   Lumbar forward flexion (65):  WNL    Lumbar extension (30): WNL*    Lumbar lateral flexion (25):  WNL WNL  Thoracic and Lumbar rotation (30 degrees):    WNL WNL  Hip Flexion (0-125):   WNL WNL  Hip IR (0-45):  WNL* WNL*  Hip ER (0-45):  WNL WNL  Hip Adduction:      Hip Abduction (0-40):  WNL WNL  Hip extension (0-15):     (*= pain, Blank rows = not tested)   STRENGTH: MMT    RLE LLE   Hip Flexion 4 4  Hip Extension 4 4  Hip Abduction     Hip Adduction     Hip ER  4 4  Hip IR  4* 4  Knee Extension 5 5  Knee Flexion 4 4   Dorsiflexion     Plantarflexion (seated) 5 5  (*= pain, Blank rows = not tested)   SPECIAL TESTS:  FABER (SN 81): negative FADIR (SN 94): negative    PALPATION:  Abdominal:  Diastasis: none, 2 fingers above, 2.5 at umbilicus, 2 fingers below umbilicus   RLQ - tenderness upon deep palpation (where Pt has hx of cysts)   EXTERNAL PELVIC EXAM: Patient educated on the purpose of the pelvic exam and articulated understanding; patient consented to the exam verbally.  Breath coordination: present but inconsistent  Voluntary Contraction: present, 1/5 MMT with abdominal bracing and Valsalva Relaxation: full  Perineal movement with sustained IAP increase ("bear down"): descent with Valsalva Perineal movement with rapid IAP increase ("cough"): no change (0= no contraction, 1= flicker, 2= weak squeeze, 3= fair squeeze with lift, 4= good squeeze and lift against resistance, 5= strong squeeze against strong resistance)    TODAY'S TREATMENT:    Neuromuscular Re-education: Reassessment of FOTO-  Urinary Problem - IE (49) - Today: 54 Bowel Constipation - IE (47) - Today: 49  Discussion on using PFM lengthening techniques and downregulation of nervous system techniques before penetrative sex to decrease pain/discomfort that happens intermittently.   Supine hooklying diaphragmatic breathing with VCs and TCs for downregulation of the nervous system and improved management of IAP  Supine modified dead bug with stable UE and movement of LE for improved IAP management   Standing pallof press with coordinated breath, GTB Bx10, for improved IAP management    Patient response to interventions: Pt able to feel the deep core contract during dead bug   Patient Education:  Patient provided with HEP including: pallof press, modified dead bug. Patient educated throughout session on appropriate technique and form using multi-modal cueing, HEP, and activity modification. Patient will benefit from  further education in order to maximize compliance and understanding for long-term therapeutic gains.    ASSESSMENT:  Clinical Impression: Patient presents with excellent motivation to participate in today's session. Pt continues to demonstrate deficits in IAP management, PFM coordination, PFM strength, PFM endurance, LE strength, posture and pain. Upon FOTO reassessment, Pt demonstrates improvement in both Urinary Problem (54) and Bowel Constipation (49) compared to IE (49 and 47). Pt required moderate VCs and TCs for proper technique and decreased bodily compensations. Pt able to progress TrA activation in various positions. Pt responded positively to all active and educational interventions. Patient will continue to benefit from skilled therapeutic intervention to address deficits in IAP management, LE strength, PFM coordination, PFM strength, PFM endurance, posture and pain in order to increase PLOF and improve overall QOL.    Objective Impairments: decreased activity tolerance, decreased coordination, decreased endurance, decreased strength, increased fascial restrictions, improper body mechanics, postural dysfunction, and pain.   Activity Limitations: lifting, bending, squatting, continence, toileting, locomotion level, and caring for others  Personal Factors: Behavior pattern, Past/current experiences, Time since onset of injury/illness/exacerbation, and 3+ comorbidities: interstitial cystitis, chronic Has, and Kawasaki disease (heart)  are also affecting patient's functional outcome.   Rehab Potential: Good  Clinical Decision Making: Evolving/moderate complexity  Evaluation Complexity: Moderate   GOALS: Goals reviewed with patient? Yes  SHORT TERM GOALS: Target date: 08/16/2022  Patient will demonstrate independence with HEP in order to maximize therapeutic gains and improve carryover from physical therapy sessions to  ADLs in the home and community. Baseline: toileting posture and  bladder irritant handout Goal status: INITIAL   LONG TERM GOALS: Target date: 09/27/2022 (08/22/22)  Patient will report less than 5 incidents of stress urinary incontinence over the course of 3 weeks while coughing/sneezing/laughing/prolonged activity in order to demonstrate improved PFM coordination, strength, and function for improved overall QOL. Baseline: every time she laughs/squats/physical activity/bending Goal status: INITIAL  2.  Patient will report decreased reliance on protective undergarments as indicated by </= a 24 hour period to demonstrate improved bladder control and allow for increased participation in activities outside of the home. Baseline: 2-3x/day large pads  Goal status: INITIAL  3.  Patient will report confidence in ability to control bladder > 8/10 in order to demonstrate improved function and ability to participate more fully in activities at home and in the community. Baseline: 5/10 Goal status: INITIAL  4.  Patient will demonstrate circumferential and sequential contraction of >4/5 MMT, > 5 sec hold x5 and 5 consecutive quick flicks with </= 10 min rest between testing bouts, and relaxation of the PFM coordinated with breath for improved management of intra-abdominal pressure and normal bowel and bladder function without the presence of pain nor incontinence in order to improve participation at home and in the community. Baseline: (7/17): 1/5 MMT/no endurance tested Goal status: INITIAL  5.  Patient will report being able to return to activities including, but not limited to: jogging, running, squatting, physical activity without limitation or disruption to indicate complete resolution of the chief concern and return to prior level of participation at home and in the community. Baseline: cannot participate in above activities due to leakage Goal status: INITIAL  6.  Patient will be able to demonstrate and verbalize pain modulation/mindfulness techniques in order  to reduce worst pain by 2 points given on the NPRS scale.  Baseline: 8/10 R abdominal pain with ovarian cysts or IC flare-ups Goal status: INITIAL   7. Patient will score >/= 61 and 57 on FOTO Urinary Problem and Bowel Constipation in order to demonstrate improved IAP management, PFM strength, PFM coordination, and overall improved QOL.   Baseline: 49 and 47, (8/15): 54 and 49   Goal status: IN PROGRESS   PLAN: PT Frequency: 1x/week  PT Duration: 12 weeks  Planned Interventions: Therapeutic exercises, Therapeutic activity, Neuromuscular re-education, Balance training, Gait training, Patient/Family education, Joint mobilization, Spinal mobilization, Cryotherapy, Moist heat, scar mobilization, Taping, and Manual therapy  Plan For Next Session: work out routine with core and breathing  [employer], PT, DPT  07/05/2022, 8:47 AM

## 2022-07-11 ENCOUNTER — Ambulatory Visit: Payer: BC Managed Care – PPO

## 2022-07-12 ENCOUNTER — Ambulatory Visit: Payer: BC Managed Care – PPO

## 2022-07-12 DIAGNOSIS — R1030 Lower abdominal pain, unspecified: Secondary | ICD-10-CM

## 2022-07-12 DIAGNOSIS — M6289 Other specified disorders of muscle: Secondary | ICD-10-CM

## 2022-07-12 DIAGNOSIS — R278 Other lack of coordination: Secondary | ICD-10-CM

## 2022-07-12 DIAGNOSIS — M6281 Muscle weakness (generalized): Secondary | ICD-10-CM

## 2022-07-12 NOTE — Therapy (Signed)
OUTPATIENT PHYSICAL THERAPY FEMALE PELVIC TREATMENT   Patient Name: Judith Chapman MRN: [id number] DOB:1991/07/22, 31 y.o., female Today's Date: 07/12/2022   PT End of Session - 07/12/22 1311     Visit Number 7    Number of Visits 12    Date for PT Re-Evaluation 08/22/22    Authorization Type IE: 05/30/22    PT Start Time 1315    PT Stop Time 1355    PT Time Calculation (min) 40 min    Activity Tolerance Patient tolerated treatment well             Past Medical History:  Diagnosis Date   Asthma    Cystitis, interstitial    Frequent headaches    Pelvic pain in female    Past Surgical History:  Procedure Laterality Date   CYSTO WITH HYDRODISTENSION N/A 08/27/2015   Procedure: CYSTOSCOPY HYDRODISTENSION AND INSTTILLATION OF MARCAINE AND PRYDIUM ;  Surgeon: Alfredo Martinez, MD;  Location: [facility];  Service: Urology;  Laterality: N/A;   CYSTO/  HYDRODISTENTION/  BLADDER BX  02-28-2012   TONSILLECTOMY     WISDOM TOOTH EXTRACTION     Patient Active Problem List   Diagnosis Date Noted   Amniotic fluid leaking 04/06/2022   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 No  OCCUPATION/SOCIAL ACTIVITIES: Photographer  PLOF: Independent   CHIEF CONCERN: After birth in May 2023, Pt had to wear briefs because she had severe episodes of leakage and she could not  tell when she was going to leak. Pt is currently still wearing pads due to the urinary leakage but it is not like it was right after giving birth. Pt tried jogging the other day and she had to stop because she began leaking. Pt has a hx of interstitial cystitis and ovarian cysts that rupture causing a lot of pain. Pt feels when they rupture and feels it particularly on the R sided lower abdominals with occasional radiation to the vagina. The feeling is described as localized to the R and tight but becomes a "sharp, stabbing" pain. Pt reports not being able to move much when these episodes happen and finds the fetal position and a heating pad to bring some relief. Pt has been concerned with appendicitis but has been ruled out by providers. Pt has taken [facility] since she was 16 and when she stopped taking them in 2021 to get pregnant, she did not notice an increase or decrease in cysts. Pt currently has an IUD placement. Pt's last episode of pain was August of 2022 (before being pregnant). Pt reports 8/10 on NPRS scale and has been to the hospital many times due to the pain in the R abdomen. Pt also has a hx frequent UTI's but has been able to tell the difference between a UTI vs. an IC flare-up. Pt has urinary leakage with squatting, jogging, and laughing. In addition, Pt has had a  hx of constipation.    LIVING ENVIRONMENT: Lives with: lives with their family Lives in: House/apartment   PATIENT GOALS: Being able to manage pain when it arises from IC, not having leakage or having to wear pads     UROLOGICAL HISTORY Fluid intake: 4-5 16oz water, 1-2 diet sodas, hot tea, occasional glass of wine  Pain with urination: No Fully empty bladder: No Stream: steady Urgency: Occasionally  Frequency: 6-10x Nocturia: occasionally 1x Leakage: Urge to void, Walking to the bathroom, Laughing, Exercise, Lifting, and Bending forward Pads: Yes:   Type: Large  Amount: 2-3x/day, not every time the pad is soiled Bladder  control (0-10): 5/10  GASTROINTESTINAL HISTORY  Pain with bowel movement: Yes Type of bowel movement:Strain Yes, [place] Stool Chart: Type 2   Frequency: 2-3 days  Fully empty rectum: No Leakage: No   SEXUAL HISTORY/FUNCTION  Pain with intercourse: No  Able to achieve orgasm?: Yes   OBSTETRICAL HISTORY Vaginal deliveries: G1P1 Tearing: Yes: two stitches Currently pregnant: No  GYNECOLOGICAL HISTORY Hysterectomy: no Pelvic Organ Prolapse: None Heaviness/pressure: yes, only when sitting on toilet    SUBJECTIVE:                                                                                                                                                                      Pt has been having lower abdominal pain since Saturday and not sure if it is related to menstrual cycle as Pt has newly inserted IUD. Pt has tried stretches (HEP), massage, and heat but nothing has made it go away.    PAIN:  Are you having pain? Yes NPRS scale: lower abdominals, 5/10   OBJECTIVE:    06/06/22  COGNITION: Overall cognitive status: Within functional limits for tasks assessed     POSTURE:  Grossly forward trunk flexion, crossed legs B and B plantarflexion  Iliac crest height: WNL Pelvic obliquity: WNL    RANGE OF MOTION:   (Norm range in degrees)  LEFT RIGHT   Lumbar forward flexion (65):  WNL    Lumbar extension (30): WNL*    Lumbar lateral flexion (25):  WNL WNL  Thoracic and Lumbar rotation (30 degrees):    WNL WNL  Hip Flexion (0-125):   WNL WNL  Hip IR (0-45):  WNL* WNL*  Hip ER (0-45):  WNL WNL  Hip Adduction:      Hip Abduction (0-40):  WNL WNL  Hip extension (0-15):     (*= pain, Blank rows = not tested)   STRENGTH: MMT    RLE LLE   Hip Flexion 4 4  Hip Extension 4 4  Hip Abduction     Hip Adduction     Hip ER  4 4  Hip IR  4* 4  Knee Extension 5 5  Knee Flexion 4 4  Dorsiflexion     Plantarflexion (seated) 5 5  (*= pain, Blank rows = not  tested)   SPECIAL TESTS:  FABER (SN 81): negative FADIR (SN 94): negative    PALPATION:  Abdominal:  Diastasis: none, 2 fingers above, 2.5 at umbilicus, 2 fingers below umbilicus   RLQ - tenderness upon deep palpation (where Pt has hx of cysts)   EXTERNAL PELVIC EXAM: Patient educated on the purpose of the pelvic exam and articulated understanding; patient consented to the exam verbally.  Breath coordination: present but inconsistent  Voluntary Contraction: present, 1/5 MMT with abdominal bracing and Valsalva Relaxation: full  Perineal movement with sustained IAP increase ("bear down"): descent with Valsalva Perineal movement with rapid IAP increase ("cough"): no change (0= no contraction, 1= flicker, 2= weak squeeze, 3= fair squeeze with lift, 4= good squeeze and lift against resistance, 5= strong squeeze against strong resistance)    TODAY'S TREATMENT:   Manual Therapy: Myofascial release at abdomen for improved tension specifically in the center and lower abdominals paired with diaphragmatic breathing  Discomfort at center and lower abdomen but relieved with increased time Decreased muscular tension felt throughout    Neuromuscular Re-education: Supine hooklying diaphragmatic breathing with VCs and TCs for downregulation of the nervous system and improved management of IAP  Supine hooklying PFM lengthening techniques with diaphragmatic breathing, VCs and TCs as needed - with heat modality x 10 mins             B Single knee to chest   Double knee to chest              "Happy baby" pose    Discussion on return to physical activity post-partum. Previously, Pt used free weights with UE and has a squat rack to target LE. Pt was jogging prior to pregnancy. Pt enjoys jogging and walking for cardio but tried post-partum and had urinary leakage. Pt would like to return to physical activity without limitation.   Discussion on urinary retention and how that can lead to urinary  leakage with transition movements as Pt has noticed she receives a signal to urinate but avoids using the restroom, then with STS she will leak.    Patient response to interventions: Pt at end of session, 2-3/10   Patient Education:  Patient provided with HEP including: no changes, can challenge herself by performing a dead bug but be aware of bodily compensations and UE/LE does not need to go into full extension. Patient educated throughout session on appropriate technique and form using multi-modal cueing, HEP, and activity modification. Patient will benefit from further education in order to maximize compliance and understanding for long-term therapeutic gains.    ASSESSMENT:  Clinical Impression: Patient presents with excellent motivation to participate in today's session. Pt continues to demonstrate deficits in IAP management, PFM coordination, PFM strength, PFM endurance, LE strength, posture and pain. Pt with increased lower abdominal pain (cramping/achy) 5/10. Pt with increased tension in the lower abdominals and towards the center of the abdomen which initially caused discomfort, but with increased time discomfort eased and muscular tension decreased. Pt required minimal VCs and TCs for pain modulation techniques. Lengthy discussion on increasing physical activity and what activities Pt engaged in prior to pregnancy without limitation/disruption. Pt ended session with 2/10 pain at lower abdominals. Pt responded positively to all manual, active, and educational interventions. Patient will continue to benefit from skilled therapeutic intervention to address  deficits in IAP management, LE strength, PFM coordination, PFM strength, PFM endurance, posture and pain in order to increase PLOF and improve overall QOL.    Objective Impairments: decreased activity tolerance, decreased coordination, decreased endurance, decreased strength, increased fascial restrictions, improper body mechanics,  postural dysfunction, and pain.   Activity Limitations: lifting, bending, squatting, continence, toileting, locomotion level, and caring for others  Personal Factors: Behavior pattern, Past/current experiences, Time since onset of injury/illness/exacerbation, and 3+ comorbidities: interstitial cystitis, chronic Has, and Kawasaki disease (heart)  are also affecting patient's functional outcome.   Rehab Potential: Good  Clinical Decision Making: Evolving/moderate complexity  Evaluation Complexity: Moderate   GOALS: Goals reviewed with patient? Yes  SHORT TERM GOALS: Target date: 08/23/2022  Patient will demonstrate independence with HEP in order to maximize therapeutic gains and improve carryover from physical therapy sessions to ADLs in the home and community. Baseline: toileting posture and bladder irritant handout Goal status: INITIAL   LONG TERM GOALS: Target date: 10/04/2022 (08/22/22)  Patient will report less than 5 incidents of stress urinary incontinence over the course of 3 weeks while coughing/sneezing/laughing/prolonged activity in order to demonstrate improved PFM coordination, strength, and function for improved overall QOL. Baseline: every time she laughs/squats/physical activity/bending Goal status: INITIAL  2.  Patient will report decreased reliance on protective undergarments as indicated by </= a 24 hour period to demonstrate improved bladder control and allow for increased participation in activities outside of the home. Baseline: 2-3x/day large pads  Goal status: INITIAL  3.  Patient will report confidence in ability to control bladder > 8/10 in order to demonstrate improved function and ability to participate more fully in activities at home and in the community. Baseline: 5/10 Goal status: INITIAL  4.  Patient will demonstrate circumferential and sequential contraction of >4/5 MMT, > 5 sec hold x5 and 5 consecutive quick flicks with </= 10 min rest between  testing bouts, and relaxation of the PFM coordinated with breath for improved management of intra-abdominal pressure and normal bowel and bladder function without the presence of pain nor incontinence in order to improve participation at home and in the community. Baseline: (7/17): 1/5 MMT/no endurance tested Goal status: INITIAL  5.  Patient will report being able to return to activities including, but not limited to: jogging, running, squatting, physical activity without limitation or disruption to indicate complete resolution of the chief concern and return to prior level of participation at home and in the community. Baseline: cannot participate in above activities due to leakage Goal status: INITIAL  6.  Patient will be able to demonstrate and verbalize pain modulation/mindfulness techniques in order to reduce worst pain by 2 points given on the NPRS scale.  Baseline: 8/10 R abdominal pain with ovarian cysts or IC flare-ups Goal status: INITIAL   7. Patient will score >/= 61 and 57 on FOTO Urinary Problem and Bowel Constipation in order to demonstrate improved IAP management, PFM strength, PFM coordination, and overall improved QOL.   Baseline: 49 and 47, (8/15): 54 and 49   Goal status: IN PROGRESS   PLAN: PT Frequency: 1x/week  PT Duration: 12 weeks  Planned Interventions: Therapeutic exercises, Therapeutic activity, Neuromuscular re-education, Balance training, Gait training, Patient/Family education, Joint mobilization, Spinal mobilization, Cryotherapy, Moist heat, scar mobilization, Taping, and Manual therapy  Plan For Next Session: work out routine with core and breathing, start with UE/LE and weight   [employer], PT, DPT  07/12/2022, 1:12 PM

## 2022-07-18 ENCOUNTER — Ambulatory Visit: Payer: BC Managed Care – PPO

## 2022-07-18 DIAGNOSIS — R1030 Lower abdominal pain, unspecified: Secondary | ICD-10-CM

## 2022-07-18 DIAGNOSIS — M6289 Other specified disorders of muscle: Secondary | ICD-10-CM

## 2022-07-18 DIAGNOSIS — M6281 Muscle weakness (generalized): Secondary | ICD-10-CM

## 2022-07-18 DIAGNOSIS — R278 Other lack of coordination: Secondary | ICD-10-CM

## 2022-07-18 NOTE — Therapy (Signed)
OUTPATIENT PHYSICAL THERAPY FEMALE PELVIC TREATMENT   Patient Name: Judith Chapman MRN: [id number] DOB:29-Aug-1991, 31 y.o., female Today's Date: 07/18/2022   PT End of Session - 07/18/22 1102     Visit Number 8    Number of Visits 12    Date for PT Re-Evaluation 08/22/22    Authorization Type IE: 05/30/22    PT Start Time 1100    PT Stop Time 1140    PT Time Calculation (min) 40 min    Activity Tolerance Patient tolerated treatment well             Past Medical History:  Diagnosis Date   Asthma    Cystitis, interstitial    Frequent headaches    Pelvic pain in female    Past Surgical History:  Procedure Laterality Date   CYSTO WITH HYDRODISTENSION N/A 08/27/2015   Procedure: CYSTOSCOPY HYDRODISTENSION AND INSTTILLATION OF MARCAINE AND PRYDIUM ;  Surgeon: Alfredo Martinez, MD;  Location: [facility] [place];  Service: Urology;  Laterality: N/A;   CYSTO/  HYDRODISTENTION/  BLADDER BX  02-28-2012   TONSILLECTOMY     WISDOM TOOTH EXTRACTION     Patient Active Problem List   Diagnosis Date Noted   Amniotic fluid leaking 04/06/2022   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 No  OCCUPATION/SOCIAL ACTIVITIES: Photographer  PLOF: Independent   CHIEF CONCERN: After birth in May 2023, Pt had to wear briefs because she had severe episodes of leakage and she could not  tell when she was going to leak. Pt is currently still wearing pads due to the urinary leakage but it is not like it was right after giving birth. Pt tried jogging the other day and she had to stop because she began leaking. Pt has a hx of interstitial cystitis and ovarian cysts that rupture causing a lot of pain. Pt feels when they rupture and feels it particularly on the R sided lower abdominals with occasional radiation to the vagina. The feeling is described as localized to the R and tight but becomes a "sharp, stabbing" pain. Pt reports not being able to move much when these episodes happen and finds the fetal position and a heating pad to bring some relief. Pt has been concerned with appendicitis but has been ruled out by providers. Pt has taken [facility] since she was 16 and when she stopped taking them in 2021 to get pregnant, she did not notice an increase or decrease in cysts. Pt currently has an IUD placement. Pt's last episode of pain was August of 2022 (before being pregnant). Pt reports 8/10 on NPRS scale and has been to the hospital many times due to the pain in the R abdomen. Pt also has a hx frequent UTI's but has been able to tell the difference between a UTI vs. an IC flare-up. Pt has urinary leakage with squatting, jogging, and laughing. In addition, Pt has had a  hx of constipation.    LIVING ENVIRONMENT: Lives with: lives with their family Lives in: House/apartment   PATIENT GOALS: Being able to manage pain when it arises from IC, not having leakage or having to wear pads     UROLOGICAL HISTORY Fluid intake: 4-5 16oz water, 1-2 diet sodas, hot tea, occasional glass of wine  Pain with urination: No Fully empty bladder: No Stream: steady Urgency: Occasionally  Frequency: 6-10x Nocturia: occasionally 1x Leakage: Urge to void, Walking to the bathroom, Laughing, Exercise, Lifting, and Bending forward Pads: Yes:   Type: Large  Amount: 2-3x/day, not every time the pad is soiled Bladder  control (0-10): 5/10  GASTROINTESTINAL HISTORY  Pain with bowel movement: Yes Type of bowel movement:Strain Yes, [place] Stool Chart: Type 2   Frequency: 2-3 days  Fully empty rectum: No Leakage: No   SEXUAL HISTORY/FUNCTION  Pain with intercourse: No  Able to achieve orgasm?: Yes   OBSTETRICAL HISTORY Vaginal deliveries: G1P1 Tearing: Yes: two stitches Currently pregnant: No  GYNECOLOGICAL HISTORY Hysterectomy: no Pelvic Organ Prolapse: None Heaviness/pressure: yes, only when sitting on toilet    SUBJECTIVE:                                                                                                                                                                      Pt is feeling better than last week in terms of abdominal pain.    PAIN:  Are you having pain? Yes NPRS scale: lower back, 2/10   OBJECTIVE:    06/06/22  COGNITION: Overall cognitive status: Within functional limits for tasks assessed     POSTURE:  Grossly forward trunk flexion, crossed legs B and B plantarflexion  Iliac crest height: WNL Pelvic obliquity: WNL    RANGE OF MOTION:   (Norm range in degrees)  LEFT RIGHT   Lumbar forward flexion (65):  WNL    Lumbar extension (30): WNL*    Lumbar lateral flexion (25):  WNL WNL  Thoracic and Lumbar rotation (30 degrees):    WNL WNL  Hip Flexion (0-125):   WNL WNL  Hip IR (0-45):  WNL* WNL*  Hip ER (0-45):  WNL WNL  Hip Adduction:      Hip Abduction (0-40):  WNL WNL  Hip extension (0-15):     (*= pain, Blank rows = not tested)   STRENGTH: MMT    RLE LLE   Hip Flexion 4 4  Hip Extension 4 4  Hip Abduction     Hip Adduction     Hip ER  4 4  Hip IR  4* 4  Knee Extension 5 5  Knee Flexion 4 4  Dorsiflexion     Plantarflexion (seated) 5 5  (*= pain, Blank rows =  not tested)   SPECIAL TESTS:  FABER (SN 81): negative FADIR (SN 94): negative    PALPATION:  Abdominal:  Diastasis: none, 2 fingers above, 2.5 at umbilicus, 2  fingers below umbilicus   RLQ - tenderness upon deep palpation (where Pt has hx of cysts)   EXTERNAL PELVIC EXAM: Patient educated on the purpose of the pelvic exam and articulated understanding; patient consented to the exam verbally.  Breath coordination: present but inconsistent  Voluntary Contraction: present, 1/5 MMT with abdominal bracing and Valsalva Relaxation: full  Perineal movement with sustained IAP increase ("bear down"): descent with Valsalva Perineal movement with rapid IAP increase ("cough"): no change (0= no contraction, 1= flicker, 2= weak squeeze, 3= fair squeeze with lift, 4= good squeeze and lift against resistance, 5= strong squeeze against strong resistance)    TODAY'S TREATMENT:   Neuromuscular Re-education: Discussion on postural awareness and proper body mechanics when lowering weight down to avoid torque or strain on lower back.  Split lunge positioning   Standing pallof press, B x10 GTB, for improved IAP management  Increased challenge: added thoracic rotation   Wall squats, x10, with VCs and TCs for postural awareness at wall, and improved IAP management   Jump squat modified with coordinated breath for improved IAP management   Modified bird dog, B x10 with VCs and TCs for improved IAP management   Patient response to interventions: Pt had some dribbling with modified jump squat    Patient Education:  Patient provided with HEP including: standing pallof press with rotation, wall squats, modified bird dog, jump squat modified. Patient educated throughout session on appropriate technique and form using multi-modal cueing, HEP, and activity modification. Patient will benefit from further education in order to maximize compliance and understanding for long-term therapeutic gains.    ASSESSMENT:  Clinical Impression: Patient presents with excellent motivation to participate in today's session. Pt continues to demonstrate deficits in IAP management, PFM  coordination, PFM strength, PFM endurance, LE strength, posture and pain. Pt with 2/10 lower back pain. Pt required moderate VCs and TCs during deep core progression in standing and in quadruped for proper body mechanics. Pt did report slight urinary leakage during modified jump squats and DPT modified accordingly. DPT explained the reasoning behind practicing jump squats as Pt would like to return to jogging/running. Pt verbalized understanding. Pt responded positively to all active and educational interventions. Patient will continue to benefit from skilled therapeutic intervention to address deficits in IAP management, LE strength, PFM coordination, PFM strength, PFM endurance, posture and pain in order to increase PLOF and improve overall QOL.    Objective Impairments: decreased activity tolerance, decreased coordination, decreased endurance, decreased strength, increased fascial restrictions, improper body mechanics, postural dysfunction, and pain.   Activity Limitations: lifting, bending, squatting, continence, toileting, locomotion level, and caring for others  Personal Factors: Behavior pattern, Past/current experiences, Time since onset of injury/illness/exacerbation, and 3+ comorbidities: interstitial cystitis, chronic Has, and Kawasaki disease (heart)  are also affecting patient's functional outcome.   Rehab Potential: Good  Clinical Decision Making: Evolving/moderate complexity  Evaluation Complexity: Moderate   GOALS: Goals reviewed with patient? Yes  SHORT TERM GOALS: Target date: 08/29/2022  Patient will demonstrate independence with HEP in order to maximize therapeutic gains and improve carryover from physical therapy sessions to ADLs in the home and community. Baseline: toileting posture and bladder irritant handout Goal status: INITIAL   LONG TERM GOALS: Target date: 10/10/2022 (08/22/22)  Patient will report less than 5 incidents of stress urinary  incontinence over the  course of 3 weeks while coughing/sneezing/laughing/prolonged activity in order to demonstrate improved PFM coordination, strength, and function for improved overall QOL. Baseline: every time she laughs/squats/physical activity/bending Goal status: INITIAL  2.  Patient will report decreased reliance on protective undergarments as indicated by </= a 24 hour period to demonstrate improved bladder control and allow for increased participation in activities outside of the home. Baseline: 2-3x/day large pads  Goal status: INITIAL  3.  Patient will report confidence in ability to control bladder > 8/10 in order to demonstrate improved function and ability to participate more fully in activities at home and in the community. Baseline: 5/10 Goal status: INITIAL  4.  Patient will demonstrate circumferential and sequential contraction of >4/5 MMT, > 5 sec hold x5 and 5 consecutive quick flicks with </= 10 min rest between testing bouts, and relaxation of the PFM coordinated with breath for improved management of intra-abdominal pressure and normal bowel and bladder function without the presence of pain nor incontinence in order to improve participation at home and in the community. Baseline: (7/17): 1/5 MMT/no endurance tested Goal status: INITIAL  5.  Patient will report being able to return to activities including, but not limited to: jogging, running, squatting, physical activity without limitation or disruption to indicate complete resolution of the chief concern and return to prior level of participation at home and in the community. Baseline: cannot participate in above activities due to leakage Goal status: INITIAL  6.  Patient will be able to demonstrate and verbalize pain modulation/mindfulness techniques in order to reduce worst pain by 2 points given on the NPRS scale.  Baseline: 8/10 R abdominal pain with ovarian cysts or IC flare-ups Goal status: INITIAL   7. Patient will score >/= 61 and  57 on FOTO Urinary Problem and Bowel Constipation in order to demonstrate improved IAP management, PFM strength, PFM coordination, and overall improved QOL.   Baseline: 49 and 47, (8/15): 54 and 49   Goal status: IN PROGRESS   PLAN: PT Frequency: 1x/week  PT Duration: 12 weeks  Planned Interventions: Therapeutic exercises, Therapeutic activity, Neuromuscular re-education, Balance training, Gait training, Patient/Family education, Joint mobilization, Spinal mobilization, Cryotherapy, Moist heat, scar mobilization, Taping, and Manual therapy  Plan For Next Session:  work out routine with core/UE weights, go to every other week? How was bird dog  [employer], PT, DPT  07/18/2022, 11:03 AM

## 2022-07-26 ENCOUNTER — Ambulatory Visit: Payer: BC Managed Care – PPO | Attending: Obstetrics

## 2022-07-26 DIAGNOSIS — M6289 Other specified disorders of muscle: Secondary | ICD-10-CM | POA: Diagnosis present

## 2022-07-26 DIAGNOSIS — R1030 Lower abdominal pain, unspecified: Secondary | ICD-10-CM

## 2022-07-26 DIAGNOSIS — M6281 Muscle weakness (generalized): Secondary | ICD-10-CM | POA: Diagnosis present

## 2022-07-26 DIAGNOSIS — R278 Other lack of coordination: Secondary | ICD-10-CM

## 2022-07-26 NOTE — Therapy (Signed)
OUTPATIENT PHYSICAL THERAPY FEMALE PELVIC TREATMENT   Patient Name: Judith Chapman MRN: [id number] DOB:10-Sep-1991, 31 y.o., female Today's Date: 07/26/2022   PT End of Session - 07/26/22 0758     Visit Number 9    Number of Visits 12    Date for PT Re-Evaluation 08/22/22    Authorization Type IE: 05/30/22    PT Start Time 0800    PT Stop Time 0840    PT Time Calculation (min) 40 min    Activity Tolerance Patient tolerated treatment well             Past Medical History:  Diagnosis Date   Asthma    Cystitis, interstitial    Frequent headaches    Pelvic pain in female    Past Surgical History:  Procedure Laterality Date   CYSTO WITH HYDRODISTENSION N/A 08/27/2015   Procedure: CYSTOSCOPY HYDRODISTENSION AND INSTTILLATION OF MARCAINE AND PRYDIUM ;  Surgeon: Alfredo Martinez, MD;  Location: [facility];  Service: Urology;  Laterality: N/A;   CYSTO/  HYDRODISTENTION/  BLADDER BX  02-28-2012   TONSILLECTOMY     WISDOM TOOTH EXTRACTION     Patient Active Problem List   Diagnosis Date Noted   Amniotic fluid leaking 04/06/2022   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 No  OCCUPATION/SOCIAL ACTIVITIES: Photographer  PLOF: Independent   CHIEF CONCERN: After birth in May 2023, Pt had to wear briefs because she had severe episodes of leakage and she could not  tell when she was going to leak. Pt is currently still wearing pads due to the urinary leakage but it is not like it was right after giving birth. Pt tried jogging the other day and she had to stop because she began leaking. Pt has a hx of interstitial cystitis and ovarian cysts that rupture causing a lot of pain. Pt feels when they rupture and feels it particularly on the R sided lower abdominals with occasional radiation to the vagina. The feeling is described as localized to the R and tight but becomes a "sharp, stabbing" pain. Pt reports not being able to move much when these episodes happen and finds the fetal position and a heating pad to bring some relief. Pt has been concerned with appendicitis but has been ruled out by providers. Pt has taken [employer] since she was 16 and when she stopped taking them in 2021 to get pregnant, she did not notice an increase or decrease in cysts. Pt currently has an IUD placement. Pt's last episode of pain was August of 2022 (before being pregnant). Pt reports 8/10 on NPRS scale and has been to the hospital many times due to the pain in the R abdomen. Pt also has a hx frequent UTI's but has been able to tell the difference between a UTI vs. an IC flare-up. Pt has urinary leakage with squatting, jogging, and laughing. In addition, Pt has had a  hx of constipation.    LIVING ENVIRONMENT: Lives with: lives with their family Lives in: House/apartment   PATIENT GOALS: Being able to manage pain when it arises from IC, not having leakage or having to wear pads     UROLOGICAL HISTORY Fluid intake: 4-5 16oz water, 1-2 diet sodas, hot tea, occasional glass of wine  Pain with urination: No Fully empty bladder: No Stream: steady Urgency: Occasionally  Frequency: 6-10x Nocturia: occasionally 1x Leakage: Urge to void, Walking to the bathroom, Laughing, Exercise, Lifting, and Bending forward Pads: Yes:   Type: Large  Amount: 2-3x/day, not every time the pad is soiled Bladder  control (0-10): 5/10  GASTROINTESTINAL HISTORY  Pain with bowel movement: Yes Type of bowel movement:Strain Yes, [place] Stool Chart: Type 2   Frequency: 2-3 days  Fully empty rectum: No Leakage: No   SEXUAL HISTORY/FUNCTION  Pain with intercourse: No  Able to achieve orgasm?: Yes   OBSTETRICAL HISTORY Vaginal deliveries: G1P1 Tearing: Yes: two stitches Currently pregnant: No  GYNECOLOGICAL HISTORY Hysterectomy: no Pelvic Organ Prolapse: None Heaviness/pressure: yes, only when sitting on toilet    SUBJECTIVE:                                                                                                                                                                      Pt has had no flare-ups this week and has been practicing HEP. Pt noticed slight urinary leakage with wall squats and modified jump squats. Discussion on modifications and slight movement.    PAIN:  Are you having pain? No NPRS scale: 0/10   OBJECTIVE:    06/06/22  COGNITION: Overall cognitive status: Within functional limits for tasks assessed     POSTURE:  Grossly forward trunk flexion, crossed legs B and B plantarflexion  Iliac crest height: WNL Pelvic obliquity: WNL    RANGE OF MOTION:   (Norm range in degrees)  LEFT RIGHT   Lumbar forward flexion (65):  WNL    Lumbar extension (30): WNL*    Lumbar lateral flexion (25):  WNL WNL  Thoracic and Lumbar rotation (30 degrees):    WNL WNL  Hip Flexion (0-125):   WNL WNL  Hip IR (0-45):  WNL* WNL*  Hip ER (0-45):  WNL WNL  Hip Adduction:      Hip Abduction (0-40):  WNL WNL  Hip extension (0-15):     (*= pain, Blank rows = not tested)   STRENGTH: MMT    RLE LLE   Hip Flexion 4 4  Hip Extension 4 4  Hip Abduction     Hip Adduction     Hip ER  4 4  Hip IR  4* 4  Knee Extension 5 5  Knee Flexion 4 4  Dorsiflexion     Plantarflexion (seated) 5 5  (*= pain, Blank rows = not tested)   SPECIAL TESTS:  FABER (SN 81):  negative FADIR (SN 94): negative    PALPATION:  Abdominal:  Diastasis: none, 2 fingers above, 2.5 at umbilicus, 2 fingers below umbilicus   RLQ - tenderness upon deep palpation (where Pt has hx of cysts)   EXTERNAL PELVIC EXAM: Patient educated on the purpose of the pelvic exam and articulated understanding; patient consented to the exam verbally.  Breath coordination: present but inconsistent  Voluntary Contraction: present, 1/5 MMT with abdominal bracing and Valsalva Relaxation: full  Perineal movement with sustained IAP increase ("bear down"): descent with Valsalva Perineal movement with rapid IAP increase ("cough"): no change (0= no contraction, 1= flicker, 2= weak squeeze, 3= fair squeeze with lift, 4= good squeeze and lift against resistance, 5= strong squeeze against strong resistance)    TODAY'S TREATMENT:   Neuromuscular Re-education: Discussion and practice on postural awareness and body mechanics with coordinated breath, VCs and TCs required throughout for form   -split squat with 4 lb weights  -squat with 4 lb weights overhead  -globet with 4 lb weights  -bench press -flys at incline or standing  -prone flys   Supine bridges with coordinated breathe for improved IAP management, VCs and TCs required   Supine 90/90 one arm fly with coordinated breath for improved IAP management, VCs and TCs required      Patient response to interventions: Pt noted very minimal dribble with globet squat but modified with smaller movement    Patient Education:  Patient provided with HEP including: all the above. Patient educated throughout session on appropriate technique and form using multi-modal cueing, HEP, and activity modification. Patient will benefit from further education in order to maximize compliance and understanding for long-term therapeutic gains.    ASSESSMENT:  Clinical Impression: Patient presents with excellent motivation to participate in today's session. Pt  continues to demonstrate deficits in IAP management, PFM coordination, PFM strength, PFM endurance, LE strength, posture and pain. Pt with no back pain currently and reports decreased urinary leakage (using pantyliners now). Today's session focused on proper body mechanics and using diaphragmatic breathing with LE/UE/deep core poses. Pt required moderate VCs and TCs throughout for proper form and to decrease bodily compensations. Pt able to demonstrate proper breathing mechanics with movement. Pt comfortable to progress to every other week. Pt responded positively to all active and educational interventions. Patient will continue to benefit from skilled therapeutic intervention to address deficits in IAP management, LE strength, PFM coordination, PFM strength, PFM endurance, posture and pain in order to increase PLOF and improve overall QOL.    Objective Impairments: decreased activity tolerance, decreased coordination, decreased endurance, decreased strength, increased fascial restrictions, improper body mechanics, postural dysfunction, and pain.   Activity Limitations: lifting, bending, squatting, continence, toileting, locomotion level, and caring for others  Personal Factors: Behavior pattern, Past/current experiences, Time since onset of injury/illness/exacerbation, and 3+ comorbidities: interstitial cystitis, chronic Has, and Kawasaki disease (heart)  are also affecting patient's functional outcome.   Rehab Potential: Good  Clinical Decision Making: Evolving/moderate complexity  Evaluation Complexity: Moderate   GOALS: Goals reviewed with patient? Yes  SHORT TERM GOALS: Target date: 08/11/2022  Patient will demonstrate independence with HEP in order to maximize therapeutic gains and improve carryover from physical therapy sessions to ADLs in the home and community. Baseline: toileting posture and bladder irritant handout; (9/5): continues to demonstrate and verbalized understanding of  HEP  Goal status: IN PROGRESS   LONG TERM GOALS: Target date: 08/22/22  Patient will report less than 5 incidents of stress urinary incontinence over the course of 3 weeks while coughing/sneezing/laughing/prolonged activity in order to demonstrate improved PFM coordination, strength, and function for improved overall QOL. Baseline: every time she laughs/squats/physical activity/bending Goal status: INITIAL  2.  Patient will report decreased reliance on protective undergarments as indicated by </= a 24 hour period to demonstrate improved bladder control and allow for increased participation in activities outside of the home. Baseline: 2-3x/day large pads  Goal status: INITIAL  3.  Patient will report confidence in ability to control bladder > 8/10 in order to demonstrate improved function and ability to participate more fully in activities at home and in the community. Baseline: 5/10 Goal status: INITIAL  4.  Patient will demonstrate circumferential and sequential contraction of >4/5 MMT, > 5 sec hold x5 and 5 consecutive quick flicks with </= 10 min rest between testing bouts, and relaxation of the PFM coordinated with breath for improved management of intra-abdominal pressure and normal bowel and bladder function without the presence of pain nor incontinence in order to improve participation at home and in the community. Baseline: (7/17): 1/5 MMT/no endurance tested Goal status: INITIAL  5.  Patient will report being able to return to activities including, but not limited to: jogging, running, squatting, physical activity without limitation or disruption to indicate complete resolution of the chief concern and return to prior level of participation at home and in the community. Baseline: cannot participate in above activities due to leakage Goal status: INITIAL  6.  Patient will be able to demonstrate and verbalize pain modulation/mindfulness techniques in order to reduce worst pain by 2  points given on the NPRS scale.  Baseline: 8/10 R abdominal pain with ovarian cysts or IC flare-ups Goal status: INITIAL   7. Patient will score >/= 61 and 57 on FOTO Urinary Problem and Bowel Constipation in order to demonstrate improved IAP management, PFM strength, PFM coordination, and overall improved QOL.   Baseline: 49 and 47, (8/15): 54 and 49   Goal status: IN PROGRESS   PLAN: PT Frequency: 1x/week  PT Duration: 12 weeks  Planned Interventions: Therapeutic exercises, Therapeutic activity, Neuromuscular re-education, Balance training, Gait training, Patient/Family education, Joint mobilization, Spinal mobilization, Cryotherapy, Moist heat, scar mobilization, Taping, and Manual therapy  Plan For Next Session: how did the two weeks go?, progress note, the knack? PFM strength?  Mildreth Reek, PT, DPT  07/26/2022, 7:58 AM

## 2022-08-09 ENCOUNTER — Ambulatory Visit: Payer: BC Managed Care – PPO

## 2022-08-23 ENCOUNTER — Ambulatory Visit: Payer: BC Managed Care – PPO | Attending: Obstetrics

## 2022-08-23 DIAGNOSIS — M6281 Muscle weakness (generalized): Secondary | ICD-10-CM | POA: Insufficient documentation

## 2022-08-23 DIAGNOSIS — M6289 Other specified disorders of muscle: Secondary | ICD-10-CM | POA: Insufficient documentation

## 2022-08-23 DIAGNOSIS — R1030 Lower abdominal pain, unspecified: Secondary | ICD-10-CM | POA: Insufficient documentation

## 2022-08-23 DIAGNOSIS — R278 Other lack of coordination: Secondary | ICD-10-CM | POA: Diagnosis not present

## 2022-08-23 NOTE — Therapy (Signed)
OUTPATIENT PHYSICAL THERAPY FEMALE PELVIC PROGRESS NOTE/RE-CERT FROM REPORTING PERIOD  05/30/22 - 08/23/22   Patient Name: Judith Chapman MRN: [id number] DOB:May 12, 1991, 31 y.o., female Today's Date: 08/23/2022   PT End of Session - 08/23/22 1402     Visit Number 10    Number of Visits 20    Date for PT Re-Evaluation 11/01/22    Authorization Type IE: 05/30/22; PN/RC: 08/23/22    PT Start Time 1400    PT Stop Time 1440    PT Time Calculation (min) 40 min    Activity Tolerance Patient tolerated treatment well             Past Medical History:  Diagnosis Date   Asthma    Cystitis, interstitial    Frequent headaches    Pelvic pain in female    Past Surgical History:  Procedure Laterality Date   CYSTO WITH HYDRODISTENSION N/A 08/27/2015   Procedure: CYSTOSCOPY HYDRODISTENSION AND INSTTILLATION OF MARCAINE AND PRYDIUM ;  Surgeon: Bjorn Loser, MD;  Location: [facility];  Service: Urology;  Laterality: N/A;   CYSTO/  HYDRODISTENTION/  BLADDER BX  02-28-2012   TONSILLECTOMY     WISDOM TOOTH EXTRACTION     Patient Active Problem List   Diagnosis Date Noted   Amniotic fluid leaking 04/06/2022   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 No  OCCUPATION/SOCIAL ACTIVITIES: Photographer  PLOF: Independent   CHIEF CONCERN: After birth in May 2023, Pt had to  wear briefs because she had severe episodes of leakage and she could not tell when she was going to leak. Pt is currently still wearing pads due to the urinary leakage but it is not like it was right after giving birth. Pt tried jogging the other day and she had to stop because she began leaking. Pt has a hx of interstitial cystitis and ovarian cysts that rupture causing a lot of pain. Pt feels when they rupture and feels it particularly on the R sided lower abdominals with occasional radiation to the vagina. The feeling is described as localized to the R and tight but becomes a "sharp, stabbing" pain. Pt reports not being able to move much when these episodes happen and finds the fetal position and a heating pad to bring some relief. Pt has been concerned with appendicitis but has been ruled out by providers. Pt has taken [facility] since she was 55 and when she stopped taking them in 2021 to get pregnant, she did not notice an increase or decrease in cysts. Pt currently has an IUD placement. Pt's last episode of pain was August of 2022 (before being pregnant). Pt reports 8/10 on NPRS scale and has been to the hospital many times due to the pain in the R abdomen. Pt also has a hx frequent UTI's but has been able to tell the difference between a UTI vs. an IC flare-up. Pt has urinary leakage with  squatting, jogging, and laughing. In addition, Pt has had a hx of constipation.    LIVING ENVIRONMENT: Lives with: lives with their family Lives in: House/apartment   PATIENT GOALS: Being able to manage pain when it arises from [place], not having leakage or having to wear pads     UROLOGICAL HISTORY Fluid intake: 4-5 16oz water, 1-2 diet sodas, hot tea, occasional glass of wine  Pain with urination: No Fully empty bladder: No Stream: steady Urgency: Occasionally  Frequency: 6-10x Nocturia: occasionally 1x Leakage: Urge to void, Walking to the bathroom, Laughing, Exercise, Lifting, and Bending forward Pads: Yes:    Type: Large  Amount: 2-3x/day, not every time the pad is soiled Bladder control (0-10): 5/10  GASTROINTESTINAL HISTORY  Pain with bowel movement: Yes Type of bowel movement:Strain Yes, [place] Stool Chart: Type 2   Frequency: 2-3 days  Fully empty rectum: No Leakage: No   SEXUAL HISTORY/FUNCTION  Pain with intercourse: No  Able to achieve orgasm?: Yes   OBSTETRICAL HISTORY Vaginal deliveries: G1P1 Tearing: Yes: two stitches Currently pregnant: No  GYNECOLOGICAL HISTORY Hysterectomy: no Pelvic Organ Prolapse: None Heaviness/pressure: yes, only when sitting on toilet    SUBJECTIVE:                                                                                                                                                                      Pt has recently start Lexapro for post-partum depression symptoms. Pt has had increased external stressors. Pt has a lot of constipation over the past couple weeks, tenderness in the lower abdomen, and hair falling. Urinary leakage has improved.    PAIN:  Are you having pain? No NPRS scale: 0/10   OBJECTIVE:    06/06/22  COGNITION: Overall cognitive status: Within functional limits for tasks assessed     POSTURE:  Grossly forward trunk flexion, crossed legs B and B plantarflexion  Iliac crest height: WNL Pelvic obliquity: WNL    RANGE OF MOTION:   (Norm range in degrees)  LEFT RIGHT   Lumbar forward flexion (65):  WNL    Lumbar extension (30): WNL*    Lumbar lateral flexion (25):  WNL WNL  Thoracic and Lumbar rotation (30 degrees):    WNL WNL  Hip Flexion (0-125):   WNL WNL  Hip IR (0-45):  WNL* WNL*  Hip ER (0-45):  WNL WNL  Hip Adduction:      Hip Abduction (0-40):  WNL WNL  Hip extension (0-15):     (*= pain, Blank rows = not tested)   STRENGTH: MMT    RLE LLE   Hip Flexion 4 4  Hip Extension 4 4  Hip Abduction     Hip Adduction     Hip ER  4 4  Hip IR  4* 4  Knee Extension 5 5  Knee Flexion 4 4   Dorsiflexion     Plantarflexion (seated) 5 5  (*= pain, Blank rows = not tested)   SPECIAL TESTS:  FABER (SN 81): negative FADIR (SN 94): negative    PALPATION:  Abdominal:  Diastasis: none, 2 fingers above, 2.5 at umbilicus, 2 fingers below umbilicus   RLQ - tenderness upon deep palpation (where Pt has hx of cysts)   EXTERNAL PELVIC EXAM: Patient educated on the purpose of the pelvic exam and articulated understanding; patient consented to the exam verbally.  Breath coordination: present but inconsistent  Voluntary Contraction: present, 1/5 MMT with abdominal bracing and Valsalva Relaxation: full  Perineal movement with sustained IAP increase ("bear down"): descent with Valsalva Perineal movement with rapid IAP increase ("cough"): no change (0= no contraction, 1= flicker, 2= weak squeeze, 3= fair squeeze with lift, 4= good squeeze and lift against resistance, 5= strong squeeze against strong resistance)    TODAY'S TREATMENT:   Neuromuscular Re-education: Review of LTGs and STGs below for reassessment  Discussion on significant improvement of goals  5/8 goals MET  FOTO Reassessment  FOTO Urinary Problem - 54 FOTO Bowel Constipation - 49   Diastasis reassessment: 1 fingers above, at umbilicus and below umbilicus  IE - 2 fingers above, 2.5 at umbilicus, 2 fingers below umbilicus   Discussion on how external stressors can impact the sympathetic nervous system and lead to affecting tension in the PFM. Pt reports noticing when she begins to hold tension in the abdomen or the pelvic floor.   Discussion on movements Pt would like to progress towards in following sessions. Pt voiced ability to return to physical activity and impactful movements as she was prior to pregnancy.   Patient response to interventions: Pt comfortable to continue every other week for a few more sessions    Patient Education:  Patient provided with HEP: no change. Patient educated throughout session  on appropriate technique and form using multi-modal cueing, HEP, and activity modification. Patient will benefit from further education in order to maximize compliance and understanding for long-term therapeutic gains.    ASSESSMENT:  Clinical Impression: Patient presents with excellent motivation to participate in today's reassessment. Pt demonstrates significant improvement from IE on 05/30/22 in regards to IAP management, PFM coordination, posture and pain. Posture in sitting has improved with minimal forward flexion and reports improved toileting posture (decreased raised heels). Pt reports improved confidence/control of bladder (8/10) from IE (5/10), decreased use of incontinence pads and type (pantyliners, 2-3x/day due to hygiene) from IE (2-3x/day all soiled, heavy incontinence pads), decrease urinary leakage with sneezing/jumping/physical activity (dribble instead of a large amount of urine), absence of DRA (1 fingers above, at umbilicus and below umbilicus, IE - 2 fingers above, 2.5 at umbilicus, 2 fingers below umbilicus ) and decreased worst R abdominal pain (6/10) with IC flare-ups compared to IE (8/10) and ability to refer back to diaphragmatic breathing and pain modulation techniques. Although Pt has demonstrated improvement since IE, Pt will continue to benefit from PFPT to address remaining deficits of urinary incontinence especially with physical activity as Pt would like to return to activity she was involved in prior to pregnancy.  Patient will continue to benefit from skilled therapeutic intervention to address deficits in IAP management, LE strength, PFM coordination and PFM strength in order to increase PLOF and improve overall QOL.    Objective Impairments: decreased activity tolerance, decreased coordination, decreased  endurance, decreased strength, increased fascial restrictions, improper body mechanics, postural dysfunction, and pain.   Activity Limitations: lifting, bending,  squatting, continence, toileting, locomotion level, and caring for others  Personal Factors: Behavior pattern, Past/current experiences, Time since onset of injury/illness/exacerbation, and 3+ comorbidities: interstitial cystitis, chronic Has, and Kawasaki disease (heart)  are also affecting patient's functional outcome.   Rehab Potential: Good  Clinical Decision Making: Evolving/moderate complexity  Evaluation Complexity: Moderate   GOALS: Goals reviewed with patient? Yes  SHORT TERM GOALS: Target date: 08/11/2022  Patient will demonstrate independence with HEP in order to maximize therapeutic gains and improve carryover from physical therapy sessions to ADLs in the home and community. Baseline: toileting posture and bladder irritant handout; (9/5): continues to demonstrate and verbalized understanding of HEP; (10/3): IND with HEP Goal status: MET   LONG TERM GOALS: Target date: 11/01/22  Patient will report less than 5 incidents of stress urinary incontinence over the course of 3 weeks while coughing/sneezing/laughing/prolonged activity in order to demonstrate improved PFM coordination, strength, and function for improved overall QOL. Baseline: every time she laughs/squats/physical activity/bending; (10/3): 5 times SUI in a dribble amount (getting up quickly, getting up from ground, too low in a squat but no every time.)  Goal status: MET  2.  Patient will report decreased reliance on protective undergarments as indicated by </= a 24 hour period to demonstrate improved bladder control and allow for increased participation in activities outside of the home. Baseline: 2-3x/day large pads; (10/3): pantyliners, 2-3x but not due to leakage, 1-2x per week where has to change pad due to leakage  Goal status: MET  3.  Patient will report confidence in ability to control bladder > 8/10 in order to demonstrate improved function and ability to participate more fully in activities at home and in  the community. Baseline: 5/10; (10/3): 8/10 Goal status: MET  4.  Patient will demonstrate circumferential and sequential contraction of >4/5 MMT, > 5 sec hold x5 and 5 consecutive quick flicks with </= 10 min rest between testing bouts, and relaxation of the PFM coordinated with breath for improved management of intra-abdominal pressure and normal bowel and bladder function without the presence of pain nor incontinence in order to improve participation at home and in the community. Baseline: (7/17): 1/5 MMT/no endurance tested Goal status: INITIAL  5.  Patient will report being able to return to activities including, but not limited to: jogging, running, squatting, physical activity without limitation or disruption to indicate complete resolution of the chief concern and return to prior level of participation at home and in the community. Baseline: cannot participate in above activities due to leakage; (10/3): has not ran/jogged but squatting and participating in physical activity with minimal leakage  Goal status: IN PROGRESS  6.  Patient will be able to demonstrate and verbalize pain modulation/mindfulness techniques in order to reduce worst pain by 2 points given on the NPRS scale.  Baseline: 8/10 R abdominal pain with ovarian cysts or IC flare-ups, (10/3): 6/10 but able to demonstrates and verbalize that pain modulation/mindfulness techniques help  Goal status: MET   7. Patient will score >/= 61 and 57 on FOTO Urinary Problem and Bowel Constipation in order to demonstrate improved IAP management, PFM strength, PFM coordination, and overall improved QOL.   Baseline: 49 and 47, (8/15): 54 and 49; (10/3): 54 and 49   Goal status: IN PROGRESS   PLAN: PT Frequency: 1x/week  PT Duration: 12 weeks  Planned Interventions: Therapeutic exercises, Therapeutic activity,  Neuromuscular re-education, Balance training, Gait training, Patient/Family education, Joint mobilization, Spinal mobilization,  Cryotherapy, Moist heat, scar mobilization, Taping, and Manual therapy  Plan For Next Session:  PFM strength/knack, jump squat onto step?, lunge jump, or overhead press with weight  Laurren Lepkowski, PT, DPT  08/23/2022, 4:34 PM

## 2022-09-06 ENCOUNTER — Ambulatory Visit: Payer: BC Managed Care – PPO

## 2022-09-08 ENCOUNTER — Ambulatory Visit: Payer: BC Managed Care – PPO

## 2022-09-08 DIAGNOSIS — M6289 Other specified disorders of muscle: Secondary | ICD-10-CM

## 2022-09-08 DIAGNOSIS — R1030 Lower abdominal pain, unspecified: Secondary | ICD-10-CM

## 2022-09-08 DIAGNOSIS — R278 Other lack of coordination: Secondary | ICD-10-CM | POA: Diagnosis not present

## 2022-09-08 DIAGNOSIS — M6281 Muscle weakness (generalized): Secondary | ICD-10-CM

## 2022-09-08 NOTE — Therapy (Signed)
OUTPATIENT PHYSICAL THERAPY FEMALE PELVIC TREATMENT    Patient Name: Judith Chapman MRN: [id number] DOB:[birth year], 31 y.o., female Today's Date: 09/08/2022   PT End of Session - 09/08/22 1144     Visit Number 11    Number of Visits 20    Date for PT Re-Evaluation 11/01/22    Authorization Type IE: 05/30/22; PN/RC: 08/23/22    PT Start Time 1144    PT Stop Time 1223    PT Time Calculation (min) 39 min    Activity Tolerance Patient tolerated treatment well             Past Medical History:  Diagnosis Date   Asthma    Cystitis, interstitial    Frequent headaches    Pelvic pain in female    Past Surgical History:  Procedure Laterality Date   CYSTO WITH HYDRODISTENSION N/A 08/27/2015   Procedure: CYSTOSCOPY HYDRODISTENSION AND INSTTILLATION OF MARCAINE AND PRYDIUM ;  Surgeon: Bjorn Loser, MD;  Location: [facility];  Service: Urology;  Laterality: N/A;   CYSTO/  HYDRODISTENTION/  BLADDER BX  02-28-2012   TONSILLECTOMY     WISDOM TOOTH EXTRACTION     Patient Active Problem List   Diagnosis Date Noted   Amniotic fluid leaking 04/06/2022   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 No  OCCUPATION/SOCIAL ACTIVITIES: Photographer  PLOF: Independent   CHIEF CONCERN: After birth in May 2023, Pt had to wear briefs because she had severe episodes of leakage  and she could not tell when she was going to leak. Pt is currently still wearing pads due to the urinary leakage but it is not like it was right after giving birth. Pt tried jogging the other day and she had to stop because she began leaking. Pt has a hx of interstitial cystitis and ovarian cysts that rupture causing a lot of pain. Pt feels when they rupture and feels it particularly on the R sided lower abdominals with occasional radiation to the vagina. The feeling is described as localized to the R and tight but becomes a "sharp, stabbing" pain. Pt reports not being able to move much when these episodes happen and finds the fetal position and a heating pad to bring some relief. Pt has been concerned with appendicitis but has been ruled out by providers. Pt has taken [facility] since she was 87 and when she stopped taking them in 2021 to get pregnant, she did not notice an increase or decrease in cysts. Pt currently has an IUD placement. Pt's last episode of pain was August of 2022 (before being pregnant). Pt reports 8/10 on NPRS scale and has been to the hospital many times due to the pain in the R abdomen. Pt also has a hx frequent UTI's but has been able to tell the difference between a UTI vs. an IC flare-up. Pt has urinary leakage with squatting, jogging, and laughing. In addition, Pt  has had a hx of constipation.    LIVING ENVIRONMENT: Lives with: lives with their family Lives in: House/apartment   PATIENT GOALS: Being able to manage pain when it arises from [place], not having leakage or having to wear pads     UROLOGICAL HISTORY Fluid intake: 4-5 16oz water, 1-2 diet sodas, hot tea, occasional glass of wine  Pain with urination: No Fully empty bladder: No Stream: steady Urgency: Occasionally  Frequency: 6-10x Nocturia: occasionally 1x Leakage: Urge to void, Walking to the bathroom, Laughing, Exercise, Lifting, and Bending forward Pads: Yes:   Type: Large  Amount: 2-3x/day, not every time the pad  is soiled Bladder control (0-10): 5/10  GASTROINTESTINAL HISTORY  Pain with bowel movement: Yes Type of bowel movement:Strain Yes, [place] Stool Chart: Type 2   Frequency: 2-3 days  Fully empty rectum: No Leakage: No   SEXUAL HISTORY/FUNCTION  Pain with intercourse: No  Able to achieve orgasm?: Yes   OBSTETRICAL HISTORY Vaginal deliveries: G1P1 Tearing: Yes: two stitches Currently pregnant: No  GYNECOLOGICAL HISTORY Hysterectomy: no Pelvic Organ Prolapse: None Heaviness/pressure: yes, only when sitting on toilet    SUBJECTIVE:                                                                                                                                                                      Pt has been doing well and practicing physical activity. Pt has not had any urinary leakage with exercise. Pt also tried not using a pantyliner and that went well not needing to change due to urinary leakage. Pt has only noticed urinary leakage with a really big sneeze and then carrying around a laundry basket.    PAIN:  Are you having pain? No NPRS scale: 0/10     OBJECTIVE:    06/06/22  COGNITION: Overall cognitive status: Within functional limits for tasks assessed     POSTURE:  Grossly forward trunk flexion, crossed legs B and B plantarflexion  Iliac crest height: WNL Pelvic obliquity: WNL    RANGE OF MOTION:   (Norm range in degrees)  LEFT RIGHT   Lumbar forward flexion (65):  WNL    Lumbar extension (30): WNL*    Lumbar lateral flexion (25):  WNL WNL  Thoracic and Lumbar rotation (30 degrees):    WNL WNL  Hip Flexion (0-125):   WNL WNL  Hip IR (0-45):  WNL* WNL*  Hip ER (0-45):  WNL WNL  Hip Adduction:      Hip Abduction (0-40):  WNL WNL  Hip extension (0-15):     (*= pain, Blank rows = not tested)   STRENGTH: MMT    RLE LLE   Hip Flexion 4 4  Hip Extension 4 4  Hip Abduction  Hip Adduction     Hip ER  4 4  Hip IR  4* 4  Knee Extension 5 5  Knee  Flexion 4 4  Dorsiflexion     Plantarflexion (seated) 5 5  (*= pain, Blank rows = not tested)   SPECIAL TESTS:  FABER (SN 81): negative FADIR (SN 94): negative    PALPATION:  Abdominal:  Diastasis: none, 2 fingers above, 2.5 at umbilicus, 2 fingers below umbilicus   RLQ - tenderness upon deep palpation (where Pt has hx of cysts)   EXTERNAL PELVIC EXAM: Patient educated on the purpose of the pelvic exam and articulated understanding; patient consented to the exam verbally.  Breath coordination: present but inconsistent  Voluntary Contraction: present, 1/5 MMT with abdominal bracing and Valsalva Relaxation: full  Perineal movement with sustained IAP increase ("bear down"): descent with Valsalva Perineal movement with rapid IAP increase ("cough"): no change (0= no contraction, 1= flicker, 2= weak squeeze, 3= fair squeeze with lift, 4= good squeeze and lift against resistance, 5= strong squeeze against strong resistance)    TODAY'S TREATMENT:   Neuromuscular Re-education: Patient educated on pressure management strategy ("the knack") with movements against gravity for improved IAP management and decreased urinary incontinence. Patient demonstrated, x3, and verbalized understanding.   Discussion on urinary urgency control and urinary retention as well as Pt reports urinary leakage probably after dismissing signals from the brain to go use the restroom. Pt given handout and verbalized understanding.   Squat jumps on 2in step with coordinated breathing for improved IAP management, Vcs and TCS required  Minimal leakage - dribble  Modified to squat jump without step   Jump squat with overhead ball - modified to regular jump squats with breath for improved IAP management (no leakage)   Squat with overhead press and twist, 5lb weight, B x5 for improved IAP management  Lunge position with PNF motion (D2 extension) and coordinated breathing for improved IAP management   DPT encouraged  Pt to try a bout of a jog to see how it feels now as that is one of the Pt's LTGs. Pt verbalized understanding.    Patient response to interventions: Pt with no more leakage after a couple attempts of jump squats with 2 in step    Patient Education:  Patient provided with HEP: all the above exercises and given handout on urinary urgency control. Patient educated throughout session on appropriate technique and form using multi-modal cueing, HEP, and activity modification. Patient will benefit from further education in order to maximize compliance and understanding for long-term therapeutic gains.    ASSESSMENT:  Clinical Impression: Patient presents with excellent motivation to participate in today's session. Pt continues to demonstrate deficits in IAP management, LE strength, PFM coordination and PFM strength. Pt has been doing well with minimal urinary leakage and even had a trial period of wearing no pantyliner and had no leakage. Most of session focused on ballistic movements such as jump squats and overhead movement to challenge the deep core and begin return to jogging/running. Pt required moderate VCs and TCs for proper technique and to decrease bodily compensations (emphasis on form). Brief discussion on urinary urgency control and urinary retention as that can cause UTIs or an IC flare-up. Pt verbalized understanding. Patient will continue to benefit from skilled therapeutic intervention to address deficits in IAP management, LE strength, PFM coordination and PFM strength in order to increase PLOF and improve overall QOL.    Objective Impairments: decreased activity tolerance, decreased coordination, decreased endurance,  decreased strength, increased fascial restrictions, improper body mechanics, postural dysfunction, and pain.   Activity Limitations: lifting, bending, squatting, continence, toileting, locomotion level, and caring for others  Personal Factors: Behavior pattern,  Past/current experiences, Time since onset of injury/illness/exacerbation, and 3+ comorbidities: interstitial cystitis, chronic Has, and Kawasaki disease (heart)  are also affecting patient's functional outcome.   Rehab Potential: Good  Clinical Decision Making: Evolving/moderate complexity  Evaluation Complexity: Moderate   GOALS: Goals reviewed with patient? Yes  SHORT TERM GOALS: Target date: 08/11/2022  Patient will demonstrate independence with HEP in order to maximize therapeutic gains and improve carryover from physical therapy sessions to ADLs in the home and community. Baseline: toileting posture and bladder irritant handout; (9/5): continues to demonstrate and verbalized understanding of HEP; (10/3): IND with HEP Goal status: MET   LONG TERM GOALS: Target date: 11/01/22  Patient will report less than 5 incidents of stress urinary incontinence over the course of 3 weeks while coughing/sneezing/laughing/prolonged activity in order to demonstrate improved PFM coordination, strength, and function for improved overall QOL. Baseline: every time she laughs/squats/physical activity/bending; (10/3): 5 times SUI in a dribble amount (getting up quickly, getting up from ground, too low in a squat but no every time.)  Goal status: MET  2.  Patient will report decreased reliance on protective undergarments as indicated by </= a 24 hour period to demonstrate improved bladder control and allow for increased participation in activities outside of the home. Baseline: 2-3x/day large pads; (10/3): pantyliners, 2-3x but not due to leakage, 1-2x per week where has to change pad due to leakage  Goal status: MET  3.  Patient will report confidence in ability to control bladder > 8/10 in order to demonstrate improved function and ability to participate more fully in activities at home and in the community. Baseline: 5/10; (10/3): 8/10 Goal status: MET  4.  Patient will demonstrate circumferential  and sequential contraction of >4/5 MMT, > 5 sec hold x5 and 5 consecutive quick flicks with </= 10 min rest between testing bouts, and relaxation of the PFM coordinated with breath for improved management of intra-abdominal pressure and normal bowel and bladder function without the presence of pain nor incontinence in order to improve participation at home and in the community. Baseline: (7/17): 1/5 MMT/no endurance tested Goal status: INITIAL  5.  Patient will report being able to return to activities including, but not limited to: jogging, running, squatting, physical activity without limitation or disruption to indicate complete resolution of the chief concern and return to prior level of participation at home and in the community. Baseline: cannot participate in above activities due to leakage; (10/3): has not ran/jogged but squatting and participating in physical activity with minimal leakage  Goal status: IN PROGRESS  6.  Patient will be able to demonstrate and verbalize pain modulation/mindfulness techniques in order to reduce worst pain by 2 points given on the NPRS scale.  Baseline: 8/10 R abdominal pain with ovarian cysts or IC flare-ups, (10/3): 6/10 but able to demonstrates and verbalize that pain modulation/mindfulness techniques help  Goal status: MET   7. Patient will score >/= 61 and 57 on FOTO Urinary Problem and Bowel Constipation in order to demonstrate improved IAP management, PFM strength, PFM coordination, and overall improved QOL.   Baseline: 49 and 47, (8/15): 54 and 49; (10/3): 54 and 49   Goal status: IN PROGRESS   PLAN: PT Frequency: 1x/week  PT Duration: 12 weeks  Planned Interventions: Therapeutic exercises, Therapeutic activity, Neuromuscular  re-education, Balance training, Gait training, Patient/Family education, Joint mobilization, Spinal mobilization, Cryotherapy, Moist heat, scar mobilization, Taping, and Manual therapy  Plan For Next Session:  how was the  jumps? PFM strength, did you try jog/run?   [employer], PT, DPT  09/08/2022, 12:37 PM

## 2022-09-18 IMAGING — MR MR CARD MORPHOLOGY WO/W CM
45 of 48 series · 45 of 48 positions shown · IV contrast (Contrast agent)
Comparison: none

CLINICAL DATA: Hx of Kawasaki disease. Evaluate for aneurysm,
infarct, scar.

EXAM:
CARDIAC MRI
TECHNIQUE: The patient was scanned on a 1.5 Tesla GE magnet. A dedicated
cardiac coil was used. Functional imaging was done using [employer]
sequences. [DATE], and 4 chamber views were done to assess for RWMA's.
Modified Cavaliere rule using a short axis stack was used to
calculate an ejection fraction on a dedicated work station using
[employer] software. The patient received 7 ml Gadavist. After 10
minutes inversion recovery sequences were used to assess for
infiltration and scar tissue.
CONTRAST:  7ml of Gadavist

[Series 4: t2_haste_db_tra_bh · axial · 8.0mm · 1.41mm/px · 1 of 20 slices shown]
[im 1/20]
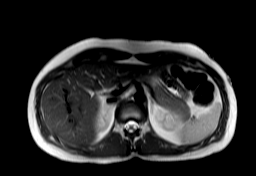

[Series 10: bSSFP · oblique · 8.0mm · 1.61mm/px · 1 of 25 slices shown (1 of 18)]
[im 1/25]
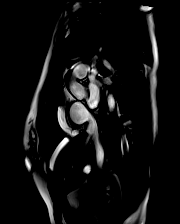

[Series 11: bSSFP · oblique · 8.0mm · 1.61mm/px · 1 of 25 slices shown (2 of 18)]
[im 1/25]
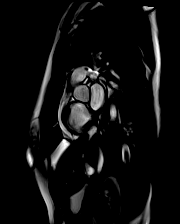

[Series 12: bSSFP · oblique · 8.0mm · 1.61mm/px · 1 of 25 slices shown (3 of 18)]
[im 1/25]
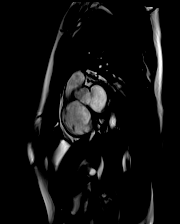

[Series 13: bSSFP · oblique · 8.0mm · 1.61mm/px · 1 of 25 slices shown (4 of 18)]
[im 1/25]
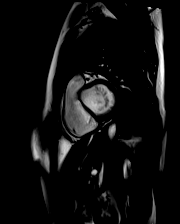

[Series 14: bSSFP · oblique · 8.0mm · 1.61mm/px · 1 of 25 slices shown (5 of 18)]
[im 1/25]
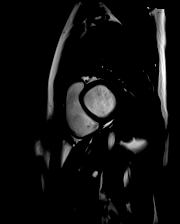

[Series 15: bSSFP · oblique · 8.0mm · 1.61mm/px · 1 of 25 slices shown (6 of 18)]
[im 1/25]
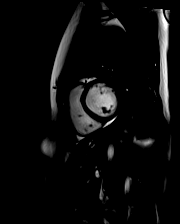

[Series 16: bSSFP · oblique · 8.0mm · 1.61mm/px · 1 of 25 slices shown (7 of 18)]
[im 1/25]
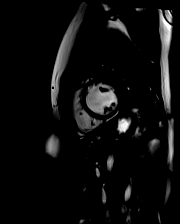

[Series 17: bSSFP · oblique · 8.0mm · 1.61mm/px · 1 of 25 slices shown (8 of 18)]
[im 1/25]
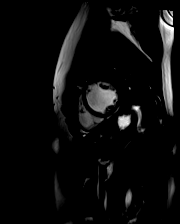

[Series 18: bSSFP · oblique · 8.0mm · 1.61mm/px · 1 of 25 slices shown (9 of 18)]
[im 1/25]
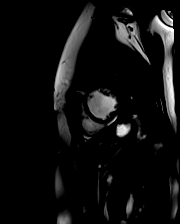

[Series 19: bSSFP · oblique · 8.0mm · 1.61mm/px · 1 of 25 slices shown (10 of 18)]
[im 1/25]
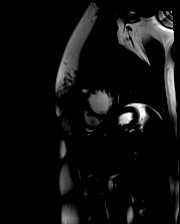

[Series 20: bSSFP · oblique · 8.0mm · 1.61mm/px · 1 of 25 slices shown (11 of 18)]
[im 1/25]
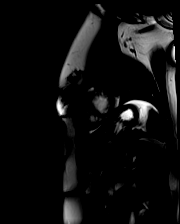

[Series 21: bSSFP · oblique · 8.0mm · 1.61mm/px · 1 of 25 slices shown (12 of 18)]
[im 1/25]
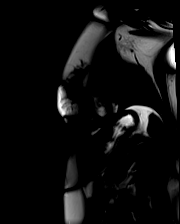

[Series 22: bSSFP · oblique · 8.0mm · 1.61mm/px · 1 of 25 slices shown (13 of 18)]
[im 1/25]
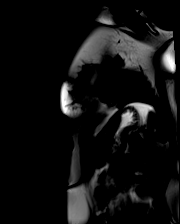

[Series 23: bSSFP · oblique · 8.0mm · 1.61mm/px · 1 of 25 slices shown (14 of 18)]
[im 1/25]
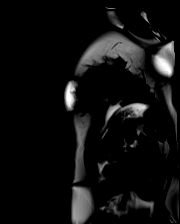

[Series 24: bSSFP · oblique · 8.0mm · 1.61mm/px · 1 of 25 slices shown (15 of 18)]
[im 1/25]
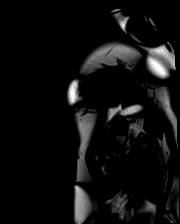

[Series 27: (id)_nav_whole-heart_non contrast · axial · 0.8mm · 0.78mm/px · 1 of 144 slices shown]
[im 1/144]
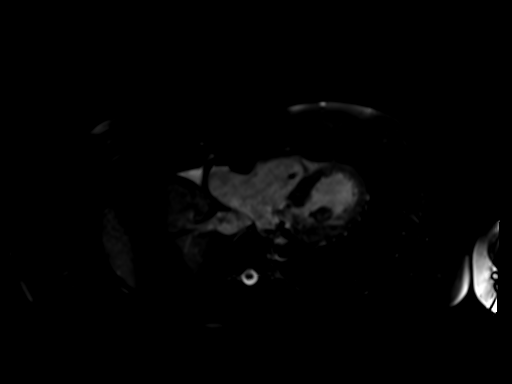

[Series 30: bSSFP · oblique · 6.0mm · 1.41mm/px · 1 of 25 slices shown (16 of 18)]
[im 1/25]
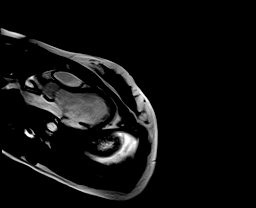

[Series 36: axial stack · axial · 8.0mm · 1.61mm/px · 1 of 25 slices shown (1 of 20)]
[im 1/25]
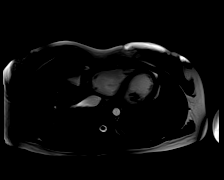

[Series 36: axial stack · axial · 8.0mm · 1.61mm/px · 1 of 25 slices shown (2 of 20)]
[im 1/25]
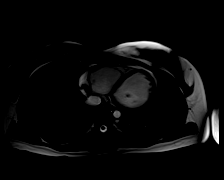

[Series 36: axial stack · axial · 8.0mm · 1.61mm/px · 1 of 25 slices shown (3 of 20)]
[im 1/25]
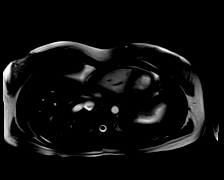

[Series 36: axial stack · axial · 8.0mm · 1.61mm/px · 1 of 25 slices shown (4 of 20)]
[im 1/25]
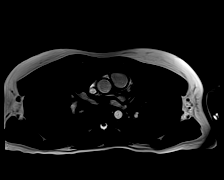

[Series 36: axial stack · axial · 8.0mm · 1.61mm/px · 1 of 25 slices shown (5 of 20)]
[im 1/25]
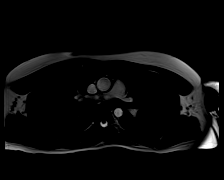

[Series 36: axial stack · axial · 8.0mm · 1.61mm/px · 1 of 25 slices shown (6 of 20)]
[im 1/25]
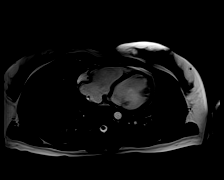

[Series 36: axial stack · axial · 8.0mm · 1.61mm/px · 1 of 25 slices shown (7 of 20)]
[im 1/25]
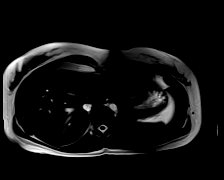

[Series 36: axial stack · axial · 8.0mm · 1.61mm/px · 1 of 25 slices shown (8 of 20)]
[im 1/25]
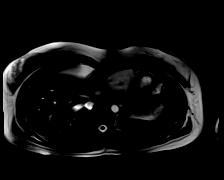

[Series 36: axial stack · axial · 8.0mm · 1.61mm/px · 1 of 25 slices shown (9 of 20)]
[im 1/25]
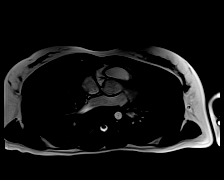

[Series 36: axial stack · axial · 8.0mm · 1.61mm/px · 1 of 25 slices shown (10 of 20)]
[im 1/25]
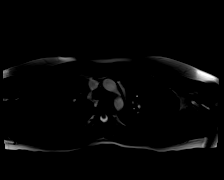

[Series 36: axial stack · axial · 8.0mm · 1.61mm/px · 1 of 25 slices shown (11 of 20)]
[im 1/25]
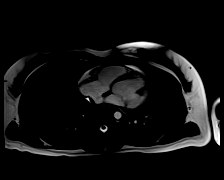

[Series 36: axial stack · axial · 8.0mm · 1.61mm/px · 1 of 25 slices shown (12 of 20)]
[im 1/25]
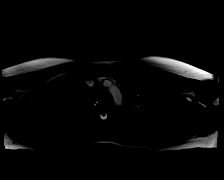

[Series 36: axial stack · axial · 8.0mm · 1.61mm/px · 1 of 25 slices shown (13 of 20)]
[im 1/25]
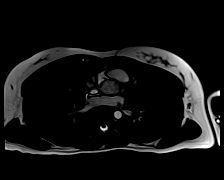

[Series 36: axial stack · axial · 8.0mm · 1.61mm/px · 1 of 25 slices shown (14 of 20)]
[im 1/25]
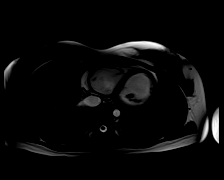

[Series 36: axial stack · axial · 8.0mm · 1.61mm/px · 1 of 25 slices shown (15 of 20)]
[im 1/25]
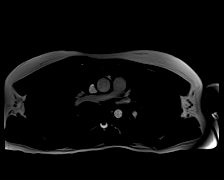

[Series 36: axial stack · axial · 8.0mm · 1.61mm/px · 1 of 25 slices shown (16 of 20)]
[im 1/25]
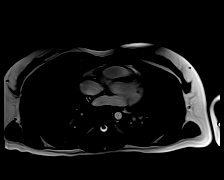

[Series 36: axial stack · axial · 8.0mm · 1.61mm/px · 1 of 25 slices shown (17 of 20)]
[im 1/25]
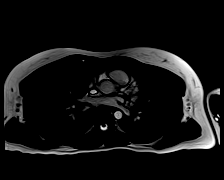

[Series 36: axial stack · axial · 8.0mm · 1.61mm/px · 1 of 25 slices shown (18 of 20)]
[im 1/25]
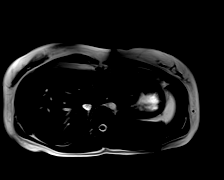

[Series 36: axial stack · axial · 8.0mm · 1.61mm/px · 1 of 25 slices shown (19 of 20)]
[im 1/25]
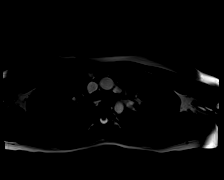

[Series 36: axial stack · axial · 8.0mm · 1.61mm/px · 1 of 25 slices shown (20 of 20)]
[im 1/25]
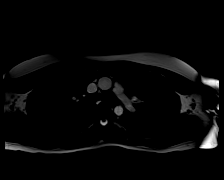

[Series 37: bSSFP · coronal · 6.0mm · 1.41mm/px · 1 of 25 slices shown (17 of 18)]
[im 1/25]
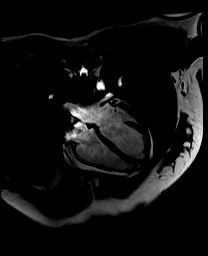

[Series 38: (id)_long_t1 · oblique · 8.0mm · 1.56mm/px · 1 of 24 slices shown]
[im 1/24]
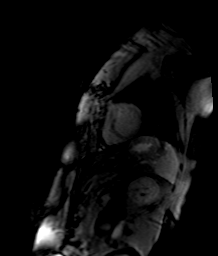

[Series 39: (id)_long_t1_moco · oblique · 8.0mm · 1.56mm/px · 1 of 24 slices shown]
[im 1/24]
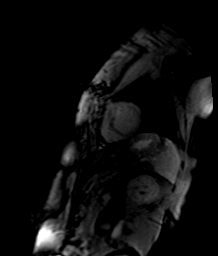

[Series 42: bSSFP · oblique · 6.0mm · 1.41mm/px · 1 of 25 slices shown (18 of 18)]
[im 1/25]
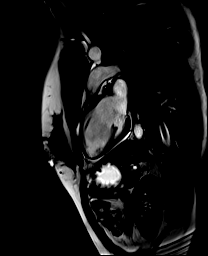

[Series 43: (id)_trufi · oblique · 8.0mm · 2.08mm/px · 1 of 9 slices shown]
[im 1/9]
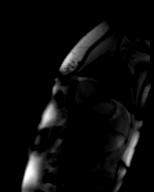

[Series 44: (id)_trufi_moco · oblique · 8.0mm · 2.08mm/px · 1 of 9 slices shown]
[im 1/9]
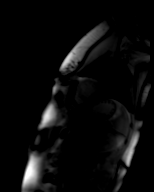

[Series 47: T1 dynamic · axial · non-contrast · 3.3mm · 1.18mm/px · 1 of 80 slices shown]
[im 1/80]
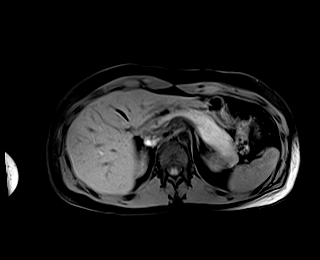

[45 of 48 positions shown; findings below may reference images not displayed]

FINDINGS: 1. Normal left ventricular size, thickness and systolic function
(LVEF =58%). There are no regional wall motion abnormalities.

There is no late gadolinium enhancement in the left ventricular
myocardium. No evidence for infarct

LVEDV: 130 ml

LVESV: 55 ml

SV: 76 ml

CO: 3.9 ml/min

Myocardial mass: 58g

2. Normal right ventricular size, thickness and systolic function
(RVEF = 62%). There are no regional wall motion abnormalities.

3.  Normal left and right atrial size.

4. Normal size of the aortic root, ascending aorta and pulmonary
artery.

5.  No significant valvular abnormalities.

6.  Normal pericardium.  trivial pericardial effusion.

7. Normal left and right coronary artery origin, no evidence for
coronary aneurysm.
IMPRESSION: 1.  Normal left and right ventricular size and function.  LVEF = 58%

2. Normal left and right coronary origin with no evidence for
aneurysm.

3. No late gadolinium enhancement or scar, no evidence of prior
infarct.

4.  No significant valvular abnormalities.

Arooj Behr

## 2022-09-20 ENCOUNTER — Ambulatory Visit: Payer: BC Managed Care – PPO

## 2022-09-20 DIAGNOSIS — R1030 Lower abdominal pain, unspecified: Secondary | ICD-10-CM

## 2022-09-20 DIAGNOSIS — M6281 Muscle weakness (generalized): Secondary | ICD-10-CM

## 2022-09-20 DIAGNOSIS — R278 Other lack of coordination: Secondary | ICD-10-CM | POA: Diagnosis not present

## 2022-09-20 DIAGNOSIS — M6289 Other specified disorders of muscle: Secondary | ICD-10-CM

## 2022-09-20 NOTE — Therapy (Signed)
OUTPATIENT PHYSICAL THERAPY FEMALE PELVIC TREATMENT    Patient Name: Judith Chapman MRN: [id number] DOB:Mar 21, 1991, 31 y.o., female Today's Date: 09/20/2022   PT End of Session - 09/20/22 1350     Visit Number 12    Number of Visits 20    Date for PT Re-Evaluation 11/01/22    Authorization Type IE: 05/30/22; PN/RC: 08/23/22    PT Start Time 1355    PT Stop Time 1435    PT Time Calculation (min) 40 min    Activity Tolerance Patient tolerated treatment well             Past Medical History:  Diagnosis Date   Asthma    Cystitis, interstitial    Frequent headaches    Pelvic pain in female    Past Surgical History:  Procedure Laterality Date   CYSTO WITH HYDRODISTENSION N/A 08/27/2015   Procedure: CYSTOSCOPY HYDRODISTENSION AND INSTTILLATION OF [place] AND [place] ;  Surgeon: Bjorn Loser, MD;  Location: [facility];  Service: Urology;  Laterality: N/A;   CYSTO/  HYDRODISTENTION/  BLADDER BX  02-28-2012   TONSILLECTOMY     WISDOM TOOTH EXTRACTION     Patient Active Problem List   Diagnosis Date Noted   Amniotic fluid leaking 04/06/2022   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 No  OCCUPATION/SOCIAL ACTIVITIES: Photographer  PLOF: Independent   CHIEF CONCERN: After birth in May 2023, Pt had to wear briefs because she had severe episodes of leakage  and she could not tell when she was going to leak. Pt is currently still wearing pads due to the urinary leakage but it is not like it was right after giving birth. Pt tried jogging the other day and she had to stop because she began leaking. Pt has a hx of interstitial cystitis and ovarian cysts that rupture causing a lot of pain. Pt feels when they rupture and feels it particularly on the R sided lower abdominals with occasional radiation to the vagina. The feeling is described as localized to the R and tight but becomes a "sharp, stabbing" pain. Pt reports not being able to move much when these episodes happen and finds the fetal position and a heating pad to bring some relief. Pt has been concerned with appendicitis but has been ruled out by providers. Pt has taken [facility] since she was 46 and when she stopped taking them in 2021 to get pregnant, she did not notice an increase or decrease in cysts. Pt currently has an IUD placement. Pt's last episode of pain was August of 2022 (before being pregnant). Pt reports 8/10 on NPRS scale and has been to the hospital many times due to the pain in the R abdomen. Pt also has a hx frequent UTI's but has been able to tell the difference between a UTI vs. an IC flare-up. Pt has urinary leakage with squatting, jogging, and laughing. In addition, Pt  has had a hx of constipation.    LIVING ENVIRONMENT: Lives with: lives with their family Lives in: House/apartment   PATIENT GOALS: Being able to manage pain when it arises from [place], not having leakage or having to wear pads     UROLOGICAL HISTORY Fluid intake: 4-5 16oz water, 1-2 diet sodas, hot tea, occasional glass of wine  Pain with urination: No Fully empty bladder: No Stream: steady Urgency: Occasionally  Frequency: 6-10x Nocturia: occasionally 1x Leakage: Urge to void, Walking to the bathroom, Laughing, Exercise, Lifting, and Bending forward Pads: Yes:   Type: Large  Amount: 2-3x/day, not every time the pad  is soiled Bladder control (0-10): 5/10  GASTROINTESTINAL HISTORY  Pain with bowel movement: Yes Type of bowel movement:Strain Yes, [place] Stool Chart: Type 2   Frequency: 2-3 days  Fully empty rectum: No Leakage: No   SEXUAL HISTORY/FUNCTION  Pain with intercourse: No  Able to achieve orgasm?: Yes   OBSTETRICAL HISTORY Vaginal deliveries: G1P1 Tearing: Yes: two stitches Currently pregnant: No  GYNECOLOGICAL HISTORY Hysterectomy: no Pelvic Organ Prolapse: None Heaviness/pressure: yes, only when sitting on toilet    SUBJECTIVE:                                                                                                                                                                      Pt is doing well. Pt has been practicing jump squats but has not been able to progress because she is still having urinary leakage. Pt has not been able to run/jog yet due to being busy. Pt has noticed more urinary leakage with transitional movements specifically while working.  PAIN:  Are you having pain? No NPRS scale: 0/10     OBJECTIVE:    06/06/22  COGNITION: Overall cognitive status: Within functional limits for tasks assessed     POSTURE:  Grossly forward trunk flexion, crossed legs B and B plantarflexion  Iliac crest height: WNL Pelvic obliquity: WNL    RANGE OF MOTION:   (Norm range in degrees)  LEFT RIGHT   Lumbar forward flexion (65):  WNL    Lumbar extension (30): WNL*    Lumbar lateral flexion (25):  WNL WNL  Thoracic and Lumbar rotation (30 degrees):    WNL WNL  Hip Flexion (0-125):   WNL WNL  Hip IR (0-45):  WNL* WNL*  Hip ER (0-45):  WNL WNL  Hip Adduction:      Hip Abduction (0-40):  WNL WNL  Hip extension (0-15):     (*= pain, Blank rows = not tested)   STRENGTH: MMT    RLE LLE   Hip Flexion 4 4  Hip Extension 4 4  Hip Abduction     Hip Adduction  Hip ER  4 4  Hip IR  4* 4  Knee Extension 5 5  Knee Flexion 4 4  Dorsiflexion      Plantarflexion (seated) 5 5  (*= pain, Blank rows = not tested)   SPECIAL TESTS:  FABER (SN 81): negative FADIR (SN 94): negative    PALPATION:  Abdominal:  Diastasis: none, 2 fingers above, 2.5 at umbilicus, 2 fingers below umbilicus   RLQ - tenderness upon deep palpation (where Pt has hx of cysts)   EXTERNAL PELVIC EXAM: Patient educated on the purpose of the pelvic exam and articulated understanding; patient consented to the exam verbally.  Breath coordination: present but inconsistent  Voluntary Contraction: present, 1/5 MMT with abdominal bracing and Valsalva Relaxation: full  Perineal movement with sustained IAP increase ("bear down"): descent with Valsalva Perineal movement with rapid IAP increase ("cough"): no change (0= no contraction, 1= flicker, 2= weak squeeze, 3= fair squeeze with lift, 4= good squeeze and lift against resistance, 5= strong squeeze against strong resistance)    TODAY'S TREATMENT:   Neuromuscular Re-education: Discussion on urinary urgency and how that can increase if PFM are holding tension. In addition, discussion on how increased cortisol levels can affect the coordination of the PFM and the techniques she has learned in PFPT.   Discussion on increased sympathetic response and how that can affect increased tension throughout the body which may lead to increase urgency to urinate.   Full body relaxation audio video for downregulation of nervous system. Pt able to demonstrate seated diaphragmatic breathing.  Supine hooklying diaphragmatic breathing with VCs and TCs for downregulation of the nervous system and improved management of IAP  Supine hooklying PFM lengthening techniques with diaphragmatic breathing, VCs and TCs as needed              B Single knee to chest              "Happy baby" pose   Tissue extensibility and pain modulation techniques of the UE, VCs and TCs required  Upper trap/levator scap Corner pose    Patient response to  interventions: Pt comfortable to progress with UE strength/endurance for next session   Patient Education:  Patient provided with HEP: upper trap/levator scap, corner pose, and full body relaxation video link. Patient educated throughout session on appropriate technique and form using multi-modal cueing, HEP, and activity modification. Patient will benefit from further education in order to maximize compliance and understanding for long-term therapeutic gains.    ASSESSMENT:  Clinical Impression: Patient presents with excellent motivation to participate in today's session. Pt continues to demonstrate deficits in IAP management, LE strength, PFM coordination and PFM strength. Pt has felt increased urinary leakage with increased cortisol levels and transitional movements. Discussion on how the sympathetic nervous system can affect PFM coordination and is especially affected if Pt is performing Valsalva maneuver. In addition, discussion on how upregulation of sympathetic nervous system can lead to muscular tension throughout the body (jaw, upper trap, PFM, feet). Rest of session focused on downregulation of the nervous system and tissue extensibility/pain modulation. Pt required moderate VCs and TCs for proper technique. Patient responded positively to all active and educational interventions. Patient will continue to benefit from skilled therapeutic intervention to address deficits in IAP management, LE strength, PFM coordination and PFM strength in order to increase PLOF and improve overall QOL.    Objective Impairments: decreased activity tolerance, decreased coordination, decreased endurance, decreased strength, increased fascial restrictions, improper body mechanics, postural dysfunction, and  pain.   Activity Limitations: lifting, bending, squatting, continence, toileting, locomotion level, and caring for others  Personal Factors: Behavior pattern, Past/current experiences, Time since onset of  injury/illness/exacerbation, and 3+ comorbidities: interstitial cystitis, chronic Has, and Kawasaki disease (heart)  are also affecting patient's functional outcome.   Rehab Potential: Good  Clinical Decision Making: Evolving/moderate complexity  Evaluation Complexity: Moderate   GOALS: Goals reviewed with patient? Yes  SHORT TERM GOALS: Target date: 08/11/2022  Patient will demonstrate independence with HEP in order to maximize therapeutic gains and improve carryover from physical therapy sessions to ADLs in the home and community. Baseline: toileting posture and bladder irritant handout; (9/5): continues to demonstrate and verbalized understanding of HEP; (10/3): IND with HEP Goal status: MET   LONG TERM GOALS: Target date: 11/01/22  Patient will report less than 5 incidents of stress urinary incontinence over the course of 3 weeks while coughing/sneezing/laughing/prolonged activity in order to demonstrate improved PFM coordination, strength, and function for improved overall QOL. Baseline: every time she laughs/squats/physical activity/bending; (10/3): 5 times SUI in a dribble amount (getting up quickly, getting up from ground, too low in a squat but no every time.)  Goal status: MET  2.  Patient will report decreased reliance on protective undergarments as indicated by </= a 24 hour period to demonstrate improved bladder control and allow for increased participation in activities outside of the home. Baseline: 2-3x/day large pads; (10/3): pantyliners, 2-3x but not due to leakage, 1-2x per week where has to change pad due to leakage  Goal status: MET  3.  Patient will report confidence in ability to control bladder > 8/10 in order to demonstrate improved function and ability to participate more fully in activities at home and in the community. Baseline: 5/10; (10/3): 8/10 Goal status: MET  4.  Patient will demonstrate circumferential and sequential contraction of >4/5 MMT, > 5 sec  hold x5 and 5 consecutive quick flicks with </= 10 min rest between testing bouts, and relaxation of the PFM coordinated with breath for improved management of intra-abdominal pressure and normal bowel and bladder function without the presence of pain nor incontinence in order to improve participation at home and in the community. Baseline: (7/17): 1/5 MMT/no endurance tested Goal status: INITIAL  5.  Patient will report being able to return to activities including, but not limited to: jogging, running, squatting, physical activity without limitation or disruption to indicate complete resolution of the chief concern and return to prior level of participation at home and in the community. Baseline: cannot participate in above activities due to leakage; (10/3): has not ran/jogged but squatting and participating in physical activity with minimal leakage  Goal status: IN PROGRESS  6.  Patient will be able to demonstrate and verbalize pain modulation/mindfulness techniques in order to reduce worst pain by 2 points given on the NPRS scale.  Baseline: 8/10 R abdominal pain with ovarian cysts or IC flare-ups, (10/3): 6/10 but able to demonstrates and verbalize that pain modulation/mindfulness techniques help  Goal status: MET   7. Patient will score >/= 61 and 57 on FOTO Urinary Problem and Bowel Constipation in order to demonstrate improved IAP management, PFM strength, PFM coordination, and overall improved QOL.   Baseline: 49 and 47, (8/15): 54 and 49; (10/3): 54 and 49   Goal status: IN PROGRESS   PLAN: PT Frequency: 1x/week  PT Duration: 12 weeks  Planned Interventions: Therapeutic exercises, Therapeutic activity, Neuromuscular re-education, Balance training, Gait training, Patient/Family education, Joint mobilization, Spinal mobilization,  Cryotherapy, Moist heat, scar mobilization, Taping, and Manual therapy  Plan For Next Session:  how was body relax/breathing this time? Anymore increased  leakage? PFM strength and UE strength/endurance   Samya Siciliano, PT, DPT  09/20/2022, 2:41 PM

## 2022-10-04 ENCOUNTER — Ambulatory Visit: Payer: BC Managed Care – PPO | Attending: Obstetrics

## 2022-10-04 DIAGNOSIS — R1030 Lower abdominal pain, unspecified: Secondary | ICD-10-CM

## 2022-10-04 DIAGNOSIS — R278 Other lack of coordination: Secondary | ICD-10-CM

## 2022-10-04 DIAGNOSIS — M6289 Other specified disorders of muscle: Secondary | ICD-10-CM | POA: Diagnosis present

## 2022-10-04 DIAGNOSIS — M6281 Muscle weakness (generalized): Secondary | ICD-10-CM

## 2022-10-04 NOTE — Therapy (Signed)
OUTPATIENT PHYSICAL THERAPY FEMALE PELVIC TREATMENT    Patient Name: Judith Chapman MRN: [id number] DOB:24-Oct-1991, 31 y.o., female Today's Date: 10/04/2022   PT End of Session - 10/04/22 1355     Visit Number 13    Number of Visits 20    Date for PT Re-Evaluation 11/01/22    Authorization Type IE: 05/30/22; PN/RC: 08/23/22    PT Start Time 1400    PT Stop Time 1440    PT Time Calculation (min) 40 min    Activity Tolerance Patient tolerated treatment well             Past Medical History:  Diagnosis Date   Asthma    Cystitis, interstitial    Frequent headaches    Pelvic pain in female    Past Surgical History:  Procedure Laterality Date   CYSTO WITH HYDRODISTENSION N/A 08/27/2015   Procedure: CYSTOSCOPY HYDRODISTENSION AND INSTTILLATION OF MARCAINE AND PRYDIUM ;  Surgeon: Bjorn Loser, MD;  Location: [place];  Service: Urology;  Laterality: N/A;   CYSTO/  HYDRODISTENTION/  BLADDER BX  02-28-2012   TONSILLECTOMY     WISDOM TOOTH EXTRACTION     Patient Active Problem List   Diagnosis Date Noted   Amniotic fluid leaking 04/06/2022   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 No  OCCUPATION/SOCIAL ACTIVITIES: Photographer  PLOF: Independent   CHIEF CONCERN: After birth in May 2023, Pt had to wear briefs because she had severe episodes of leakage  and she could not tell when she was going to leak. Pt is currently still wearing pads due to the urinary leakage but it is not like it was right after giving birth. Pt tried jogging the other day and she had to stop because she began leaking. Pt has a hx of interstitial cystitis and ovarian cysts that rupture causing a lot of pain. Pt feels when they rupture and feels it particularly on the R sided lower abdominals with occasional radiation to the vagina. The feeling is described as localized to the R and tight but becomes a "sharp, stabbing" pain. Pt reports not being able to move much when these episodes happen and finds the fetal position and a heating pad to bring some relief. Pt has been concerned with appendicitis but has been ruled out by providers. Pt has taken [facility] since she was 10 and when she stopped taking them in 2021 to get pregnant, she did not notice an increase or decrease in cysts. Pt currently has an IUD placement. Pt's last episode of pain was August of 2022 (before being pregnant). Pt reports 8/10 on NPRS scale and has been to the hospital many times due to the pain in the R abdomen. Pt also has a hx frequent UTI's but has been able to tell the difference between a UTI vs. an IC flare-up. Pt has urinary leakage with squatting, jogging, and laughing. In addition, Pt  has had a hx of constipation.    LIVING ENVIRONMENT: Lives with: lives with their family Lives in: House/apartment   PATIENT GOALS: Being able to manage pain when it arises from [place], not having leakage or having to wear pads     UROLOGICAL HISTORY Fluid intake: 4-5 16oz water, 1-2 diet sodas, hot tea, occasional glass of wine  Pain with urination: No Fully empty bladder: No Stream: steady Urgency: Occasionally  Frequency: 6-10x Nocturia: occasionally 1x Leakage: Urge to void, Walking to the bathroom, Laughing, Exercise, Lifting, and Bending forward Pads: Yes:   Type: Large  Amount: 2-3x/day, not every time the pad  is soiled Bladder control (0-10): 5/10  GASTROINTESTINAL HISTORY  Pain with bowel movement: Yes Type of bowel movement:Strain Yes, [place] Stool Chart: Type 2   Frequency: 2-3 days  Fully empty rectum: No Leakage: No   SEXUAL HISTORY/FUNCTION  Pain with intercourse: No  Able to achieve orgasm?: Yes   OBSTETRICAL HISTORY Vaginal deliveries: G1P1 Tearing: Yes: two stitches Currently pregnant: No  GYNECOLOGICAL HISTORY Hysterectomy: no Pelvic Organ Prolapse: None Heaviness/pressure: yes, only when sitting on toilet    SUBJECTIVE:                                                                                                                                                                      Pt has been seeing improvement in urinary leakage. Pt has had more tension in the lower abdomen and not sure if it is related to her menstrual cycle as well. Pt has had very minimal dribbling more when lifting heavy objects or during jumping techniques discussed in past sessions.   PAIN:  Are you having pain? No NPRS scale: 0/10     OBJECTIVE:    06/06/22  COGNITION: Overall cognitive status: Within functional limits for tasks assessed     POSTURE:  Grossly forward trunk flexion, crossed legs B and B plantarflexion  Iliac crest height: WNL Pelvic obliquity: WNL    RANGE OF MOTION:   (Norm range in degrees)  LEFT RIGHT   Lumbar forward flexion (65):  WNL    Lumbar extension (30): WNL*    Lumbar lateral flexion (25):  WNL WNL  Thoracic and Lumbar rotation (30 degrees):    WNL WNL  Hip Flexion (0-125):   WNL WNL  Hip IR (0-45):  WNL* WNL*  Hip ER (0-45):  WNL WNL  Hip Adduction:      Hip Abduction (0-40):  WNL WNL  Hip extension (0-15):     (*= pain, Blank rows = not tested)   STRENGTH: MMT    RLE LLE   Hip Flexion 4 4  Hip Extension 4 4  Hip Abduction     Hip Adduction  Hip ER  4 4  Hip IR  4* 4  Knee Extension 5 5  Knee Flexion 4 4  Dorsiflexion      Plantarflexion (seated) 5 5  (*= pain, Blank rows = not tested)   SPECIAL TESTS:  FABER (SN 81): negative FADIR (SN 94): negative    PALPATION:  Abdominal:  Diastasis: none, 2 fingers above, 2.5 at umbilicus, 2 fingers below umbilicus   RLQ - tenderness upon deep palpation (where Pt has hx of cysts)   EXTERNAL PELVIC EXAM: Patient educated on the purpose of the pelvic exam and articulated understanding; patient consented to the exam verbally.  Breath coordination: present but inconsistent  Voluntary Contraction: present, 1/5 MMT with abdominal bracing and Valsalva Relaxation: full  Perineal movement with sustained IAP increase ("bear down"): descent with Valsalva Perineal movement with rapid IAP increase ("cough"): no change (0= no contraction, 1= flicker, 2= weak squeeze, 3= fair squeeze with lift, 4= good squeeze and lift against resistance, 5= strong squeeze against strong resistance)    TODAY'S TREATMENT:   Neuromuscular Re-education: Discussion and practice on lifting mechanics with significant emphasis on posture/stance and breathing - 35 lb box (added 10lb weight) to simulate camera case and other heavy lifting performed in the home and in the community  VCs and TCs for proper technique and to decrease bodily compensations Slight dribbling of urine initially but improved with cueing to breath   Posterior chain activation to aid in posture with coordinated breath and improve IAP management   x10, RTB standing rows x10, RTB standing lat pull downs  Discussion on decreasing gluteal activation in stance or it sitting as that can affect PFM tension causing fatigue and then leakage when PFM are required in transitional movements or during heavy lifting. Pt verbalized understanding.   Patient response to interventions: Pt noticed in session some gluteal activation in standing   Patient Education:  Patient provided with HEP: standing rows and lat pull downs, continuing  practicing lifting techniques. Patient educated throughout session on appropriate technique and form using multi-modal cueing, HEP, and activity modification. Patient will benefit from further education in order to maximize compliance and understanding for long-term therapeutic gains.    ASSESSMENT:  Clinical Impression: Patient presents with excellent motivation to participate in today's session. Pt continues to demonstrate deficits in IAP management, LE strength, PFM coordination and PFM strength. After some discussion, able to pinpoint where Pt is still having difficulty with minimal urinary leakage (dribble) which is during heavy lifting for work then occasionally during transitional movements. Discussion on proper lifting techniques and body mechanics with 35 lb box to simulate work Administrator, arts and also carrying heavier objects (carseat/laundry/furniture) in the home. Pt required cueing for coordinating breath with movement and bringing object closer to self to minimize torque on body. With continued time, Pt able to demonstrate understanding. Pt also reports some increase tension felt in the lower abdomen but has been having increased external stressors. Focused on posterior chain activation in standing to aid in posture to decrease increased R shoulders and forward flexion in sitting. Pt required moderate VCs and TCs for proper technique with RTB and coordinated breathing. Patient responded positively to all active and educational interventions. Patient will continue to benefit from skilled therapeutic intervention to address deficits in IAP management, LE strength, PFM coordination and PFM strength in order to increase PLOF and improve overall QOL.    Objective Impairments: decreased activity tolerance, decreased coordination, decreased endurance, decreased strength, increased fascial restrictions, improper body  mechanics, postural dysfunction, and pain.   Activity Limitations: lifting, bending,  squatting, continence, toileting, locomotion level, and caring for others  Personal Factors: Behavior pattern, Past/current experiences, Time since onset of injury/illness/exacerbation, and 3+ comorbidities: interstitial cystitis, chronic Has, and Kawasaki disease (heart)  are also affecting patient's functional outcome.   Rehab Potential: Good  Clinical Decision Making: Evolving/moderate complexity  Evaluation Complexity: Moderate   GOALS: Goals reviewed with patient? Yes  SHORT TERM GOALS: Target date: 08/11/2022  Patient will demonstrate independence with HEP in order to maximize therapeutic gains and improve carryover from physical therapy sessions to ADLs in the home and community. Baseline: toileting posture and bladder irritant handout; (9/5): continues to demonstrate and verbalized understanding of HEP; (10/3): IND with HEP Goal status: MET   LONG TERM GOALS: Target date: 11/01/22  Patient will report less than 5 incidents of stress urinary incontinence over the course of 3 weeks while coughing/sneezing/laughing/prolonged activity in order to demonstrate improved PFM coordination, strength, and function for improved overall QOL. Baseline: every time she laughs/squats/physical activity/bending; (10/3): 5 times SUI in a dribble amount (getting up quickly, getting up from ground, too low in a squat but no every time.)  Goal status: MET  2.  Patient will report decreased reliance on protective undergarments as indicated by </= a 24 hour period to demonstrate improved bladder control and allow for increased participation in activities outside of the home. Baseline: 2-3x/day large pads; (10/3): pantyliners, 2-3x but not due to leakage, 1-2x per week where has to change pad due to leakage  Goal status: MET  3.  Patient will report confidence in ability to control bladder > 8/10 in order to demonstrate improved function and ability to participate more fully in activities at home and in  the community. Baseline: 5/10; (10/3): 8/10 Goal status: MET  4.  Patient will demonstrate circumferential and sequential contraction of >4/5 MMT, > 5 sec hold x5 and 5 consecutive quick flicks with </= 10 min rest between testing bouts, and relaxation of the PFM coordinated with breath for improved management of intra-abdominal pressure and normal bowel and bladder function without the presence of pain nor incontinence in order to improve participation at home and in the community. Baseline: (7/17): 1/5 MMT/no endurance tested Goal status: INITIAL  5.  Patient will report being able to return to activities including, but not limited to: jogging, running, squatting, physical activity without limitation or disruption to indicate complete resolution of the chief concern and return to prior level of participation at home and in the community. Baseline: cannot participate in above activities due to leakage; (10/3): has not ran/jogged but squatting and participating in physical activity with minimal leakage  Goal status: IN PROGRESS  6.  Patient will be able to demonstrate and verbalize pain modulation/mindfulness techniques in order to reduce worst pain by 2 points given on the NPRS scale.  Baseline: 8/10 R abdominal pain with ovarian cysts or IC flare-ups, (10/3): 6/10 but able to demonstrates and verbalize that pain modulation/mindfulness techniques help  Goal status: MET   7. Patient will score >/= 61 and 57 on FOTO Urinary Problem and Bowel Constipation in order to demonstrate improved IAP management, PFM strength, PFM coordination, and overall improved QOL.   Baseline: 49 and 47, (8/15): 54 and 49; (10/3): 54 and 49   Goal status: IN PROGRESS   PLAN: PT Frequency: 1x/week  PT Duration: 12 weeks  Planned Interventions: Therapeutic exercises, Therapeutic activity, Neuromuscular re-education, Balance training, Gait training, Patient/Family education,  Joint mobilization, Spinal mobilization,  Cryotherapy, Moist heat, scar mobilization, Taping, and Manual therapy  Plan For Next Session:  how was body relax/breathing this time? Anymore increased leakage? PFM strength and UE strength/endurance, jogging on treadmill   [employer], PT, DPT  10/04/2022, 2:00 PM

## 2022-10-11 ENCOUNTER — Ambulatory Visit: Payer: BC Managed Care – PPO

## 2022-10-18 ENCOUNTER — Ambulatory Visit: Payer: BC Managed Care – PPO

## 2022-10-18 DIAGNOSIS — M6289 Other specified disorders of muscle: Secondary | ICD-10-CM

## 2022-10-18 DIAGNOSIS — R1030 Lower abdominal pain, unspecified: Secondary | ICD-10-CM

## 2022-10-18 DIAGNOSIS — R278 Other lack of coordination: Secondary | ICD-10-CM | POA: Diagnosis not present

## 2022-10-18 DIAGNOSIS — M6281 Muscle weakness (generalized): Secondary | ICD-10-CM

## 2022-10-18 NOTE — Therapy (Signed)
OUTPATIENT PHYSICAL THERAPY FEMALE PELVIC TREATMENT    Patient Name: Judith Chapman MRN: [id number] DOB:01-19-1991, 31 y.o., female Today's Date: 10/18/2022   PT End of Session - 10/18/22 1402     Visit Number 14    Number of Visits 20    Date for PT Re-Evaluation 11/01/22    Authorization Type IE: 05/30/22; PN/RC: 08/23/22    PT Start Time 1400    PT Stop Time 1440    PT Time Calculation (min) 40 min    Activity Tolerance Patient tolerated treatment well             Past Medical History:  Diagnosis Date   Asthma    Cystitis, interstitial    Frequent headaches    Pelvic pain in female    Past Surgical History:  Procedure Laterality Date   CYSTO WITH HYDRODISTENSION N/A 08/27/2015   Procedure: CYSTOSCOPY HYDRODISTENSION AND INSTTILLATION OF MARCAINE AND PRYDIUM ;  Surgeon: Bjorn Loser, MD;  Location: [facility];  Service: Urology;  Laterality: N/A;   CYSTO/  HYDRODISTENTION/  BLADDER BX  02-28-2012   TONSILLECTOMY     WISDOM TOOTH EXTRACTION     Patient Active Problem List   Diagnosis Date Noted   Amniotic fluid leaking 04/06/2022   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 No  OCCUPATION/SOCIAL ACTIVITIES: Photographer  PLOF: Independent   CHIEF CONCERN: After birth in May 2023, Pt had to wear briefs because she had severe episodes of leakage  and she could not tell when she was going to leak. Pt is currently still wearing pads due to the urinary leakage but it is not like it was right after giving birth. Pt tried jogging the other day and she had to stop because she began leaking. Pt has a hx of interstitial cystitis and ovarian cysts that rupture causing a lot of pain. Pt feels when they rupture and feels it particularly on the R sided lower abdominals with occasional radiation to the vagina. The feeling is described as localized to the R and tight but becomes a "sharp, stabbing" pain. Pt reports not being able to move much when these episodes happen and finds the fetal position and a heating pad to bring some relief. Pt has been concerned with appendicitis but has been ruled out by providers. Pt has taken [facility] since she was 12 and when she stopped taking them in 2021 to get pregnant, she did not notice an increase or decrease in cysts. Pt currently has an IUD placement. Pt's last episode of pain was August of 2022 (before being pregnant). Pt reports 8/10 on NPRS scale and has been to the hospital many times due to the pain in the R abdomen. Pt also has a hx frequent UTI's but has been able to tell the difference between a UTI vs. an IC flare-up. Pt has urinary leakage with squatting, jogging, and laughing. In addition, Pt  has had a hx of constipation.    LIVING ENVIRONMENT: Lives with: lives with their family Lives in: House/apartment   PATIENT GOALS: Being able to manage pain when it arises from [place], not having leakage or having to wear pads     UROLOGICAL HISTORY Fluid intake: 4-5 16oz water, 1-2 diet sodas, hot tea, occasional glass of wine  Pain with urination: No Fully empty bladder: No Stream: steady Urgency: Occasionally  Frequency: 6-10x Nocturia: occasionally 1x Leakage: Urge to void, Walking to the bathroom, Laughing, Exercise, Lifting, and Bending forward Pads: Yes:   Type: Large  Amount: 2-3x/day, not every time the pad  is soiled Bladder control (0-10): 5/10  GASTROINTESTINAL HISTORY  Pain with bowel movement: Yes Type of bowel movement:Strain Yes, [place] Stool Chart: Type 2   Frequency: 2-3 days  Fully empty rectum: No Leakage: No   SEXUAL HISTORY/FUNCTION  Pain with intercourse: No  Able to achieve orgasm?: Yes   OBSTETRICAL HISTORY Vaginal deliveries: G1P1 Tearing: Yes: two stitches Currently pregnant: No  GYNECOLOGICAL HISTORY Hysterectomy: no Pelvic Organ Prolapse: None Heaviness/pressure: yes, only when sitting on toilet    SUBJECTIVE:                                                                                                                                                                      Pt has been doing well overall with minimal leakage. Pt's abdominal pain has improved with none currently but has noticed some increased tension in the lower back. Pt went to the chiropractic yesterday and it helped.     PAIN:  Are you having pain? No NPRS scale: 4/10   OBJECTIVE:    06/06/22  COGNITION: Overall cognitive status: Within functional limits for tasks assessed     POSTURE:  Grossly forward trunk flexion, crossed legs B and B plantarflexion  Iliac crest height: WNL Pelvic obliquity: WNL    RANGE OF MOTION:   (Norm range in degrees)  LEFT RIGHT   Lumbar forward flexion (65):  WNL    Lumbar extension (30): WNL*    Lumbar lateral flexion (25):  WNL WNL  Thoracic and Lumbar rotation (30 degrees):    WNL WNL  Hip Flexion (0-125):   WNL WNL  Hip IR (0-45):  WNL* WNL*  Hip ER (0-45):  WNL WNL  Hip Adduction:      Hip Abduction (0-40):  WNL WNL  Hip extension (0-15):     (*= pain, Blank rows = not tested)   STRENGTH: MMT    RLE LLE   Hip Flexion 4 4  Hip Extension 4 4  Hip Abduction     Hip Adduction     Hip ER  4 4  Hip IR  4* 4  Knee Extension 5 5  Knee Flexion 4 4  Dorsiflexion     Plantarflexion (seated) 5 5  (*= pain, Blank rows = not  tested)   SPECIAL TESTS:  FABER (SN 81): negative FADIR (SN 94): negative    PALPATION:  Abdominal:  Diastasis: none, 2 fingers above, 2.5 at umbilicus, 2 fingers below umbilicus   RLQ - tenderness upon deep palpation (where Pt has hx of cysts)   EXTERNAL PELVIC EXAM: Patient educated on the purpose of the pelvic exam and articulated understanding; patient consented to the exam verbally.  Breath coordination: present but inconsistent  Voluntary Contraction: present, 1/5 MMT with abdominal bracing and Valsalva Relaxation: full  Perineal movement with sustained IAP increase ("bear down"): descent with Valsalva Perineal movement with rapid IAP increase ("cough"): no change (0= no contraction, 1= flicker, 2= weak squeeze, 3= fair squeeze with lift, 4= good squeeze and lift against resistance, 5= strong squeeze against strong resistance)    TODAY'S TREATMENT:   Manual Therapy: STM at B paraspinals for improved pain modulation Increased muscular tension B but more pain on R>L  CPAs on L2-L5, Grade II-III, for improved mobility of the spine Increased pain at L5 with some hypomobility noted   Neuromuscular Re-education: Seated swiss ball 3-way (forward, left and right) for improved tissue extensibility and pain modulation of the spine   Seated mermaid pose with swiss ball for improved tissue extensibility and pain modulation   Improved IAP management and postural stability with brief SLS Wall plank with mountain climbers on exhale  Wall plank with fire hydrants pose on exhale  Standing overhead hold with march on exhale   Discussion on continuing practicing lifting mechanics and body mechanics throughout daily activities in the home and outside the home to reduce incidence of acute back injury or constant dull back ache. Pt verbalized understanding.    Patient response to interventions: Pt ended session with 2/10 low back ache   Patient Education:  Patient provided with HEP:  all the above. Patient educated throughout session on appropriate technique and form using multi-modal cueing, HEP, and activity modification. Patient will benefit from further education in order to maximize compliance and understanding for long-term therapeutic gains.    ASSESSMENT:  Clinical Impression: Patient presents with excellent motivation to participate in today's session. Pt continues to demonstrate deficits in IAP management, LE strength, PFM coordination and PFM strength. Pt has continued to have minimal leakage but has noticed more of a constant dull ache in the lower back. After discussion, DPT continues to advise Pt on proper body mechanics during ADLs and caring for young child. Pt continues to require moderate VCs and TCs during increased challenges to TrA activation emphasizing single leg stability. Upon palpation, Pt with hypomobility at L5 compared to L3-L4 with no pain and able to tolerate Grade II-III mobs, but with increased discomfort at paraspinals more R>L. Patient responded positively to manual, active, and educational interventions. Patient will continue to benefit from skilled therapeutic intervention to address deficits in IAP management, LE strength, PFM coordination and PFM strength in order to increase PLOF and improve overall QOL.    Objective Impairments: decreased activity tolerance, decreased coordination, decreased endurance, decreased strength, increased fascial restrictions, improper body mechanics, postural dysfunction, and pain.   Activity Limitations: lifting, bending, squatting, continence, toileting, locomotion level, and caring for others  Personal Factors: Behavior pattern, Past/current experiences, Time since onset of injury/illness/exacerbation, and 3+ comorbidities: interstitial cystitis, chronic Has, and Kawasaki disease (heart)  are also affecting  patient's functional outcome.   Rehab Potential: Good  Clinical Decision Making: Evolving/moderate  complexity  Evaluation Complexity: Moderate   GOALS: Goals reviewed with patient? Yes  SHORT TERM GOALS: Target date: 08/11/2022  Patient will demonstrate independence with HEP in order to maximize therapeutic gains and improve carryover from physical therapy sessions to ADLs in the home and community. Baseline: toileting posture and bladder irritant handout; (9/5): continues to demonstrate and verbalized understanding of HEP; (10/3): IND with HEP Goal status: MET   LONG TERM GOALS: Target date: 11/01/22  Patient will report less than 5 incidents of stress urinary incontinence over the course of 3 weeks while coughing/sneezing/laughing/prolonged activity in order to demonstrate improved PFM coordination, strength, and function for improved overall QOL. Baseline: every time she laughs/squats/physical activity/bending; (10/3): 5 times SUI in a dribble amount (getting up quickly, getting up from ground, too low in a squat but no every time.)  Goal status: MET  2.  Patient will report decreased reliance on protective undergarments as indicated by </= a 24 hour period to demonstrate improved bladder control and allow for increased participation in activities outside of the home. Baseline: 2-3x/day large pads; (10/3): pantyliners, 2-3x but not due to leakage, 1-2x per week where has to change pad due to leakage  Goal status: MET  3.  Patient will report confidence in ability to control bladder > 8/10 in order to demonstrate improved function and ability to participate more fully in activities at home and in the community. Baseline: 5/10; (10/3): 8/10 Goal status: MET  4.  Patient will demonstrate circumferential and sequential contraction of >4/5 MMT, > 5 sec hold x5 and 5 consecutive quick flicks with </= 10 min rest between testing bouts, and relaxation of the PFM coordinated with breath for improved management of intra-abdominal pressure and normal bowel and bladder function without the  presence of pain nor incontinence in order to improve participation at home and in the community. Baseline: (7/17): 1/5 MMT/no endurance tested Goal status: INITIAL  5.  Patient will report being able to return to activities including, but not limited to: jogging, running, squatting, physical activity without limitation or disruption to indicate complete resolution of the chief concern and return to prior level of participation at home and in the community. Baseline: cannot participate in above activities due to leakage; (10/3): has not ran/jogged but squatting and participating in physical activity with minimal leakage  Goal status: IN PROGRESS  6.  Patient will be able to demonstrate and verbalize pain modulation/mindfulness techniques in order to reduce worst pain by 2 points given on the NPRS scale.  Baseline: 8/10 R abdominal pain with ovarian cysts or IC flare-ups, (10/3): 6/10 but able to demonstrates and verbalize that pain modulation/mindfulness techniques help  Goal status: MET   7. Patient will score >/= 61 and 57 on FOTO Urinary Problem and Bowel Constipation in order to demonstrate improved IAP management, PFM strength, PFM coordination, and overall improved QOL.   Baseline: 49 and 47, (8/15): 54 and 49; (10/3): 54 and 49   Goal status: IN PROGRESS   PLAN: PT Frequency: 1x/week  PT Duration: 12 weeks  Planned Interventions: Therapeutic exercises, Therapeutic activity, Neuromuscular re-education, Balance training, Gait training, Patient/Family education, Joint mobilization, Spinal mobilization, Cryotherapy, Moist heat, scar mobilization, Taping, and Manual therapy  Plan For Next Session:  single leg stance, UE strength/endurance, jogging on treadmill   [employer], PT, DPT  10/18/2022, 2:03 PM

## 2022-11-01 ENCOUNTER — Ambulatory Visit: Payer: BC Managed Care – PPO | Attending: Obstetrics

## 2022-11-01 DIAGNOSIS — R1030 Lower abdominal pain, unspecified: Secondary | ICD-10-CM | POA: Diagnosis present

## 2022-11-01 DIAGNOSIS — R278 Other lack of coordination: Secondary | ICD-10-CM | POA: Diagnosis present

## 2022-11-01 DIAGNOSIS — M6289 Other specified disorders of muscle: Secondary | ICD-10-CM

## 2022-11-01 DIAGNOSIS — M6281 Muscle weakness (generalized): Secondary | ICD-10-CM | POA: Diagnosis present

## 2022-11-01 NOTE — Therapy (Signed)
OUTPATIENT PHYSICAL THERAPY FEMALE PELVIC DISCHARGE   Patient Name: Judith Chapman MRN: [id number] DOB:1991-04-19, 31 y.o., female Today's Date: 11/01/2022   PT End of Session - 11/01/22 1402     Visit Number 15    Number of Visits 20    Date for PT Re-Evaluation 11/01/22    Authorization Type IE: 05/30/22; PN/RC: 08/23/22    PT Start Time 1400    PT Stop Time 1430    PT Time Calculation (min) 30 min    Activity Tolerance Patient tolerated treatment well             Past Medical History:  Diagnosis Date   Asthma    Cystitis, interstitial    Frequent headaches    Pelvic pain in female    Past Surgical History:  Procedure Laterality Date   CYSTO WITH HYDRODISTENSION N/A 08/27/2015   Procedure: CYSTOSCOPY HYDRODISTENSION AND INSTTILLATION OF MARCAINE AND PRYDIUM ;  Surgeon: Bjorn Loser, MD;  Location: [facility];  Service: Urology;  Laterality: N/A;   CYSTO/  HYDRODISTENTION/  BLADDER BX  02-28-2012   TONSILLECTOMY     WISDOM TOOTH EXTRACTION     Patient Active Problem List   Diagnosis Date Noted   Amniotic fluid leaking 04/06/2022   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 No  OCCUPATION/SOCIAL ACTIVITIES: Photographer  PLOF: Independent   CHIEF CONCERN: After birth in May 2023, Pt had to wear briefs because she had severe episodes of leakage  and she could not tell when she was going to leak. Pt is currently still wearing pads due to the urinary leakage but it is not like it was right after giving birth. Pt tried jogging the other day and she had to stop because she began leaking. Pt has a hx of interstitial cystitis and ovarian cysts that rupture causing a lot of pain. Pt feels when they rupture and feels it particularly on the R sided lower abdominals with occasional radiation to the vagina. The feeling is described as localized to the R and tight but becomes a "sharp, stabbing" pain. Pt reports not being able to move much when these episodes happen and finds the fetal position and a heating pad to bring some relief. Pt has been concerned with appendicitis but has been ruled out by providers. Pt has taken [facility] since she was 39 and when she stopped taking them in 2021 to get pregnant, she did not notice an increase or decrease in cysts. Pt currently has an IUD placement. Pt's last episode of pain was August of 2022 (before being pregnant). Pt reports 8/10 on NPRS scale and has been to the hospital many times due to the pain in the R abdomen. Pt also has a hx frequent UTI's but has been able to tell the difference between a UTI vs. an IC flare-up. Pt has urinary leakage with squatting, jogging, and laughing. In addition, Pt has  had a hx of constipation.    LIVING ENVIRONMENT: Lives with: lives with their family Lives in: House/apartment   PATIENT GOALS: Being able to manage pain when it arises from [place], not having leakage or having to wear pads     UROLOGICAL HISTORY Fluid intake: 4-5 16oz water, 1-2 diet sodas, hot tea, occasional glass of wine  Pain with urination: No Fully empty bladder: No Stream: steady Urgency: Occasionally  Frequency: 6-10x Nocturia: occasionally 1x Leakage: Urge to void, Walking to the bathroom, Laughing, Exercise, Lifting, and Bending forward Pads: Yes:   Type: Large  Amount: 2-3x/day, not every time the pad  is soiled Bladder control (0-10): 5/10  GASTROINTESTINAL HISTORY  Pain with bowel movement: Yes Type of bowel movement:Strain Yes, [place] Stool Chart: Type 2   Frequency: 2-3 days  Fully empty rectum: No Leakage: No   SEXUAL HISTORY/FUNCTION  Pain with intercourse: No  Able to achieve orgasm?: Yes   OBSTETRICAL HISTORY Vaginal deliveries: G1P1 Tearing: Yes: two stitches Currently pregnant: No  GYNECOLOGICAL HISTORY Hysterectomy: no Pelvic Organ Prolapse: None Heaviness/pressure: yes, only when sitting on toilet    SUBJECTIVE:                                                                                                                                                                      Pt has been doing great. Pt mentioned not using incontinence pantyliner in the home or in the community.     PAIN:  Are you having pain? No NPRS scale:   OBJECTIVE:    06/06/22  COGNITION: Overall cognitive status: Within functional limits for tasks assessed     POSTURE:  Grossly forward trunk flexion, crossed legs B and B plantarflexion  Iliac crest height: WNL Pelvic obliquity: WNL    RANGE OF MOTION:   (Norm range in degrees)  LEFT RIGHT   Lumbar forward flexion (65):  WNL    Lumbar extension (30): WNL*    Lumbar lateral flexion (25):  WNL WNL  Thoracic and Lumbar rotation (30 degrees):    WNL WNL  Hip Flexion (0-125):   WNL WNL  Hip IR (0-45):  WNL* WNL*  Hip ER (0-45):  WNL WNL  Hip Adduction:      Hip Abduction (0-40):  WNL WNL  Hip extension (0-15):     (*= pain, Blank rows = not tested)   STRENGTH: MMT    RLE LLE   Hip Flexion 4 4  Hip Extension 4 4  Hip Abduction     Hip Adduction     Hip ER  4 4  Hip IR  4* 4  Knee Extension 5 5  Knee Flexion 4 4  Dorsiflexion     Plantarflexion (seated) 5 5  (*=  pain, Blank rows = not tested)   SPECIAL TESTS:  FABER (SN 81): negative FADIR (SN 94): negative    PALPATION:  Abdominal:  Diastasis:  none, 2 fingers above, 2.5 at umbilicus, 2 fingers below umbilicus   RLQ - tenderness upon deep palpation (where Pt has hx of cysts)   EXTERNAL PELVIC EXAM: Patient educated on the purpose of the pelvic exam and articulated understanding; patient consented to the exam verbally.  Breath coordination: present but inconsistent  Voluntary Contraction: present, 1/5 MMT with abdominal bracing and Valsalva Relaxation: full  Perineal movement with sustained IAP increase ("bear down"): descent with Valsalva Perineal movement with rapid IAP increase ("cough"): no change (0= no contraction, 1= flicker, 2= weak squeeze, 3= fair squeeze with lift, 4= good squeeze and lift against resistance, 5= strong squeeze against strong resistance)    TODAY'S TREATMENT:   Neuromuscular Re-education: Reassessment of FOTO for discharge FOTO Urinary Problem: 10/3 - 54, Today: 64 FOTO Bowel Constipation: 10/3 - 49, Today: 52   Review of goals below:  Significant improvement even from last re-eval on 10/3  Pt met 6/8 goals  EXTERNAL PELVIC EXAM: Patient educated on the purpose of the pelvic exam and articulated understanding; patient consented to the exam verbally.  Breath coordination: fully present, (IE: present but inconsistent) Voluntary Contraction: 3/5 MMT with 4 consecutive quick flicks (IE: present, 1/5 MMT with abdominal bracing and Valsalva)  Patient response to interventions: Pt comfortable to discharge and will continue with HEP    Patient Education:  Patient provided with HEP: PFM strength handout. Patient educated throughout session on appropriate technique and form using multi-modal cueing, HEP, and activity modification. Patient will benefit from further education in order to maximize compliance and understanding for long-term therapeutic gains.    ASSESSMENT:  Clinical Impression: Patient presents with excellent motivation to participate in today's discharge session. Pt has demonstrated  significant improvement in relation to PFM dysfunction since last re-eval on 08/23/22. Based on FOTO Urinary Problem (64) and Bowel Constipation (52), Pt continues to have improvement over the past few months and met the goal for FOTO Urinary Problem. Pt has had less than 5 incidents of SUI and mainly occur occasionally with a sneezing episode. Pt is not using incontinence pads on the daily in the home or in the community, but did mention having to use during increased physical activity (working out) as Pt has minimal urinary leakage. Pt continues to use breathing mechanics to guide movements during her workouts and proper body mechanics. During external PFM assessment, Pt able to demonstrate full breath coordination and 3/5 MMT with a consecutive 4 quick flicks (IE: breath coordination inconsistent and 1/5 MMT with no strength tested). Discussion on how to add PFM strength to HEP and how to increase reps in the upcoming months. Pt verbalized understanding and given handout for HEP. Pt has met 6/8 goals below. Pt agrees and is adequate for discharge from [facility]. Pt given rehab services card if Pt interested in care in the future.    Objective Impairments: decreased activity tolerance, decreased coordination, decreased endurance, decreased strength, increased fascial restrictions, improper body mechanics, postural dysfunction, and pain.   Activity Limitations: lifting, bending, squatting, continence, toileting, locomotion level, and caring for others  Personal Factors: Behavior pattern, Past/current experiences, Time since onset of injury/illness/exacerbation, and 3+ comorbidities: interstitial cystitis, chronic Has, and Kawasaki disease (heart)  are also affecting patient's functional outcome.   Rehab Potential: Good  Clinical Decision Making: Evolving/moderate complexity  Evaluation Complexity: Moderate  GOALS: Goals reviewed with patient? Yes  SHORT TERM GOALS: Target date: 08/11/2022  Patient  will demonstrate independence with HEP in order to maximize therapeutic gains and improve carryover from physical therapy sessions to ADLs in the home and community. Baseline: toileting posture and bladder irritant handout; (9/5): continues to demonstrate and verbalized understanding of HEP; (10/3): IND with HEP Goal status: MET   LONG TERM GOALS: Target date: 11/01/22  Patient will report less than 5 incidents of stress urinary incontinence over the course of 3 weeks while coughing/sneezing/laughing/prolonged activity in order to demonstrate improved PFM coordination, strength, and function for improved overall QOL. Baseline: every time she laughs/squats/physical activity/bending; (10/3): 5 times SUI in a dribble amount (getting up quickly, getting up from ground, too low in a squat but no every time.); (12/12): occasionally with sneezing but very minimal amount  Goal status: MET  2.  Patient will report decreased reliance on protective undergarments as indicated by </= a 24 hour period to demonstrate improved bladder control and allow for increased participation in activities outside of the home. Baseline: 2-3x/day large pads; (10/3): pantyliners, 2-3x but not due to leakage, 1-2x per week where has to change pad due to leakage (12/12): Pt has worn pantyliners in the home or in the community, only with activity  Goal status: MET  3.  Patient will report confidence in ability to control bladder > 8/10 in order to demonstrate improved function and ability to participate more fully in activities at home and in the community. Baseline: 5/10; (10/3): 8/10 Goal status: MET  4.  Patient will demonstrate circumferential and sequential contraction of >4/5 MMT, > 5 sec hold x5 and 5 consecutive quick flicks with </= 10 min rest between testing bouts, and relaxation of the PFM coordinated with breath for improved management of intra-abdominal pressure and normal bowel and bladder function without the  presence of pain nor incontinence in order to improve participation at home and in the community. Baseline: (7/17): 1/5 MMT/no endurance tested; (12/12): 3/5 MMT with 4 quick flicks, no endurance tested  Goal status: NOT MET  5.  Patient will report being able to return to activities including, but not limited to: jogging, running, squatting, physical activity without limitation or disruption to indicate complete resolution of the chief concern and return to prior level of participation at home and in the community. Baseline: cannot participate in above activities due to leakage; (10/3): has not ran/jogged but squatting and participating in physical activity with minimal leakage; (12/12): Pt has occasional dribbling with squatting and some physical activity but Pt does not limit herself Goal status: MET  6.  Patient will be able to demonstrate and verbalize pain modulation/mindfulness techniques in order to reduce worst pain by 2 points given on the NPRS scale.  Baseline: 8/10 R abdominal pain with ovarian cysts or IC flare-ups, (10/3): 6/10 but able to demonstrates and verbalize that pain modulation/mindfulness techniques help  Goal status: MET   7. Patient will score >/= 61 and 57 on FOTO Urinary Problem and Bowel Constipation in order to demonstrate improved IAP management, PFM strength, PFM coordination, and overall improved QOL.   Baseline: 49 and 47, (8/15): 54 and 49; (10/3): 54 and 49; (12/12): 64 and 52  Goal status: NOT MET (only Urinary Problem)    PLAN: PT Frequency: 1x/week  PT Duration: 12 weeks  Planned Interventions: Therapeutic exercises, Therapeutic activity, Neuromuscular re-education, Balance training, Gait training, Patient/Family education, Joint mobilization, Spinal mobilization, Cryotherapy, Moist heat, scar mobilization, Taping,  and Manual therapy   Ataya Murdy, PT, DPT  11/01/2022, 2:42 PM

## 2022-11-15 ENCOUNTER — Ambulatory Visit: Payer: BC Managed Care – PPO
